# Patient Record
Sex: Female | Born: 1935 | Race: White | Hispanic: No | Smoking: Never smoker
Health system: Southern US, Community
[De-identification: ages and names within clinical notes are randomized; demographics above are authoritative.]

## PROBLEM LIST (undated history)

## (undated) DIAGNOSIS — F0390 Unspecified dementia without behavioral disturbance: Secondary | ICD-10-CM

## (undated) DIAGNOSIS — I972 Postmastectomy lymphedema syndrome: Secondary | ICD-10-CM

## (undated) DIAGNOSIS — M109 Gout, unspecified: Secondary | ICD-10-CM

## (undated) DIAGNOSIS — F419 Anxiety disorder, unspecified: Secondary | ICD-10-CM

## (undated) DIAGNOSIS — C801 Malignant (primary) neoplasm, unspecified: Secondary | ICD-10-CM

## (undated) DIAGNOSIS — E785 Hyperlipidemia, unspecified: Secondary | ICD-10-CM

## (undated) DIAGNOSIS — H409 Unspecified glaucoma: Secondary | ICD-10-CM

## (undated) DIAGNOSIS — F329 Major depressive disorder, single episode, unspecified: Secondary | ICD-10-CM

## (undated) DIAGNOSIS — N289 Disorder of kidney and ureter, unspecified: Secondary | ICD-10-CM

## (undated) DIAGNOSIS — F32A Depression, unspecified: Secondary | ICD-10-CM

## (undated) DIAGNOSIS — M810 Age-related osteoporosis without current pathological fracture: Secondary | ICD-10-CM

## (undated) DIAGNOSIS — H269 Unspecified cataract: Secondary | ICD-10-CM

## (undated) HISTORY — PX: EYE SURGERY: SHX253

## (undated) HISTORY — PX: ABDOMINAL HYSTERECTOMY: SHX81

## (undated) HISTORY — DX: Age-related osteoporosis without current pathological fracture: M81.0

## (undated) HISTORY — DX: Hyperlipidemia, unspecified: E78.5

## (undated) HISTORY — DX: Postmastectomy lymphedema syndrome: I97.2

## (undated) HISTORY — PX: BREAST SURGERY: SHX581

## (undated) HISTORY — DX: Malignant (primary) neoplasm, unspecified: C80.1

## (undated) HISTORY — DX: Unspecified cataract: H26.9

## (undated) HISTORY — DX: Unspecified glaucoma: H40.9

---

## 2004-12-05 ENCOUNTER — Other Ambulatory Visit: Payer: Self-pay

## 2004-12-05 ENCOUNTER — Emergency Department: Payer: Self-pay | Admitting: Emergency Medicine

## 2007-02-27 ENCOUNTER — Other Ambulatory Visit: Payer: Self-pay

## 2007-02-27 ENCOUNTER — Emergency Department: Payer: Self-pay | Admitting: Emergency Medicine

## 2007-03-01 ENCOUNTER — Encounter: Payer: Self-pay | Admitting: Family Medicine

## 2007-03-19 ENCOUNTER — Encounter: Payer: Self-pay | Admitting: Family Medicine

## 2007-04-18 ENCOUNTER — Encounter: Payer: Self-pay | Admitting: Family Medicine

## 2007-11-06 ENCOUNTER — Ambulatory Visit: Payer: Self-pay | Admitting: Gastroenterology

## 2008-04-24 IMAGING — US US EXTREM UP VENOUS*L*
1 series · 17 of 24 positions shown · non-contrast
Comparison: none

REASON FOR EXAM: left arm swelling, rm 18
COMMENTS:

[Series 1: us extrem up venous*left* · 17 of 31 slices shown]
[im 1/31]
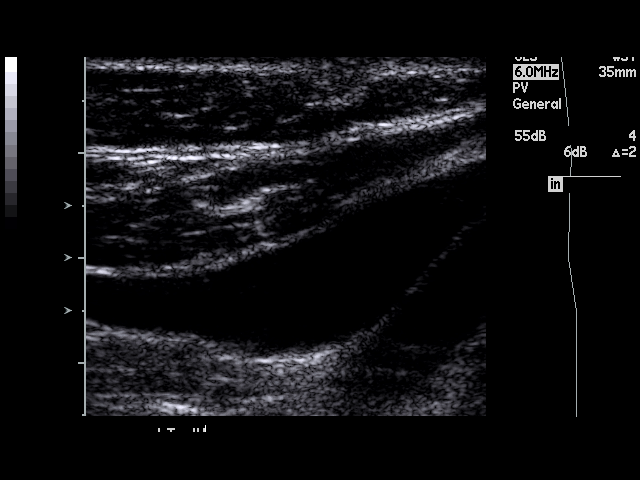
[im 3/31]
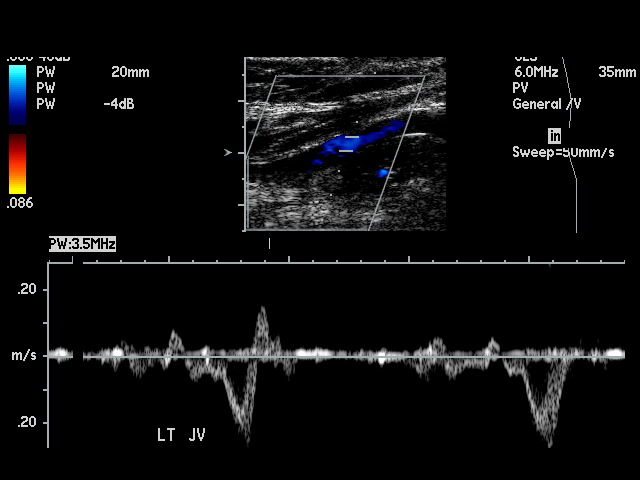
[im 4/31]
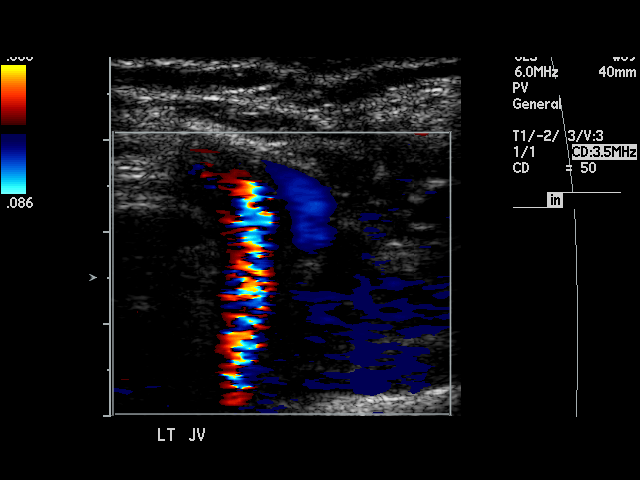
[im 6/31]
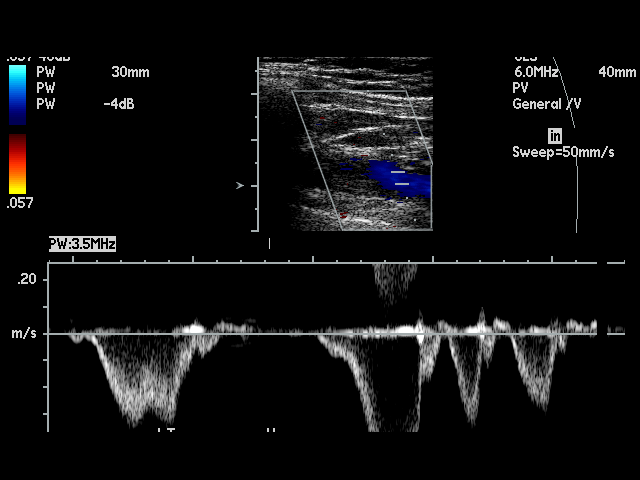
[im 8/31]
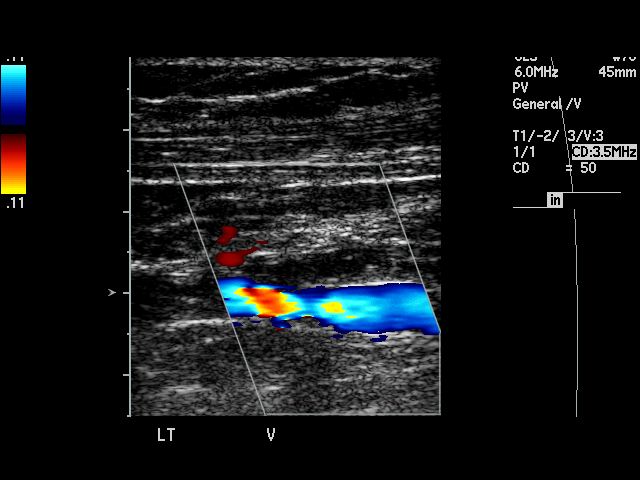
[im 10/31]
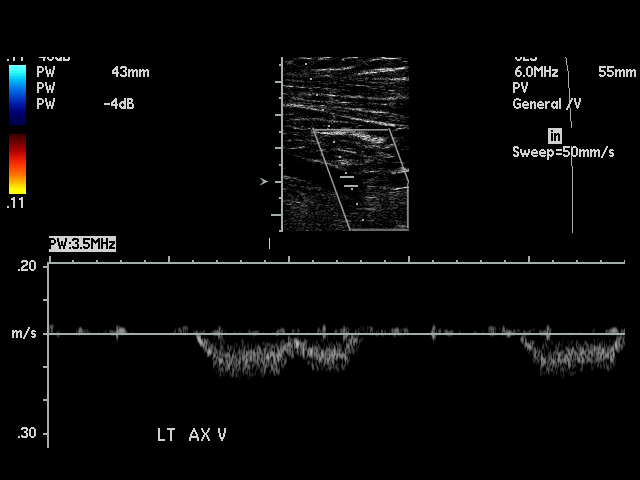
[im 12/31]
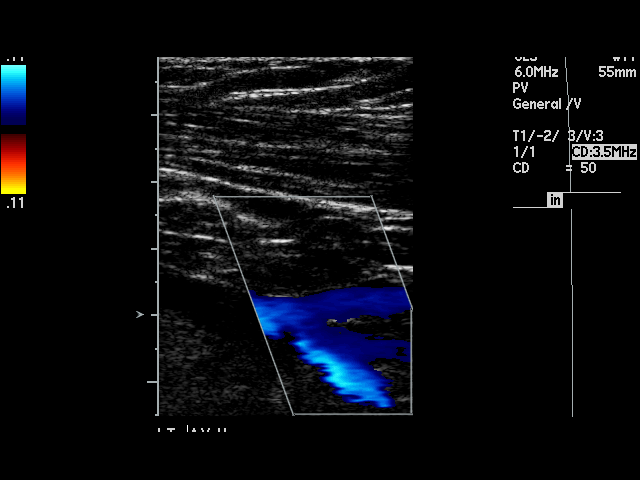
[im 14/31]
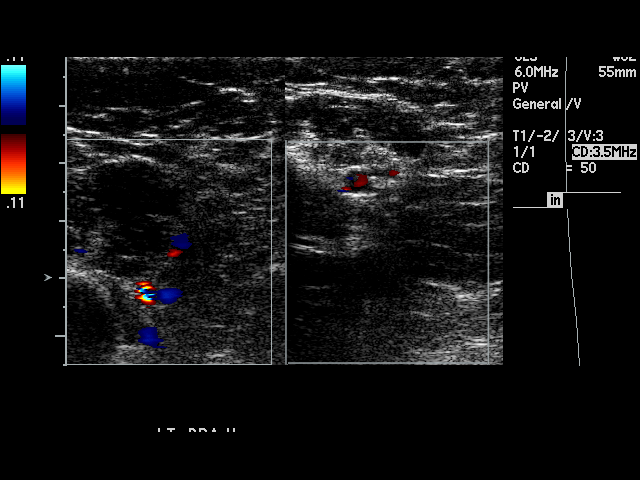
[im 16/31]
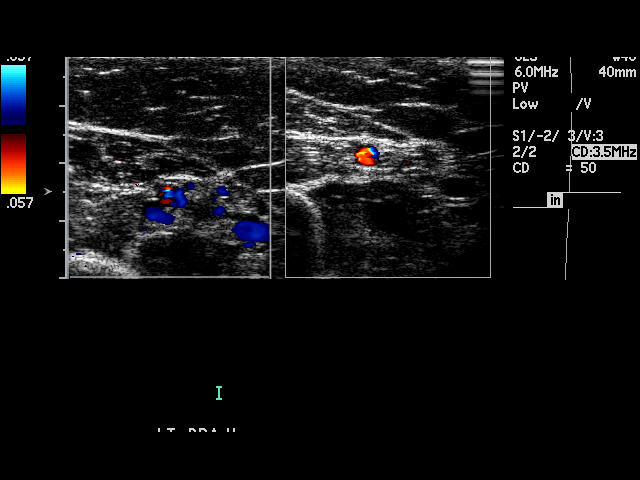
[im 17/31]
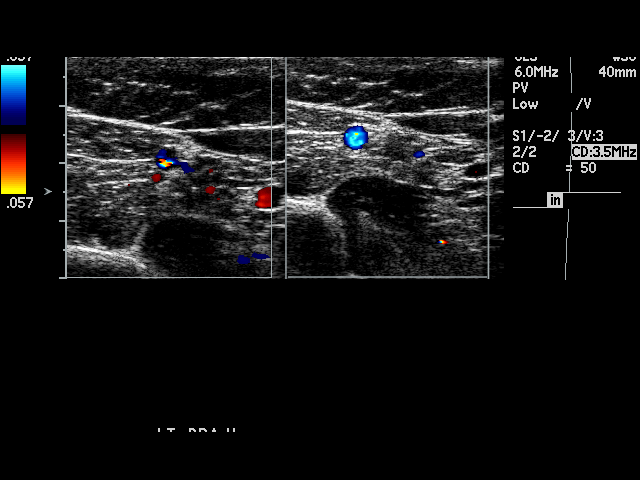
[im 19/31]
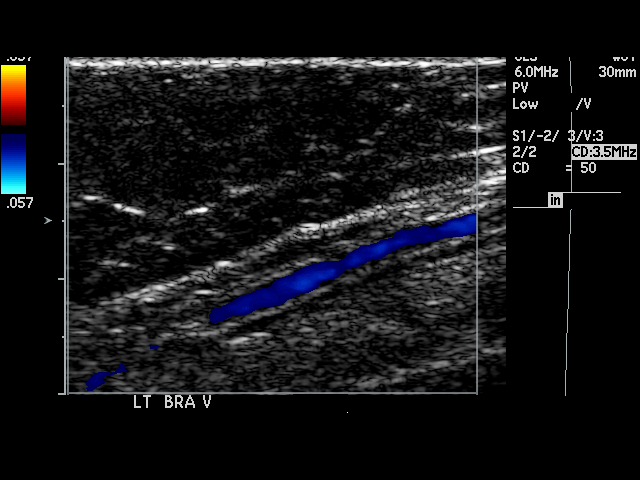
[im 21/31]
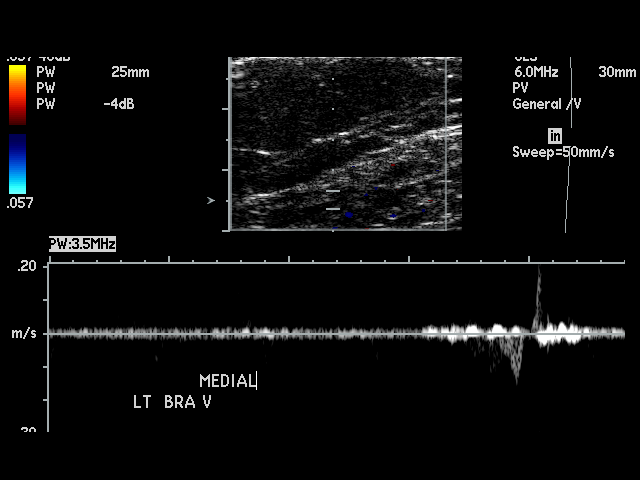
[im 23/31]
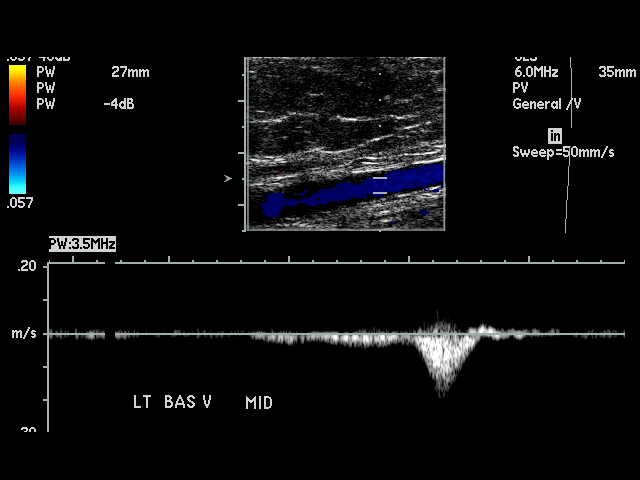
[im 25/31]
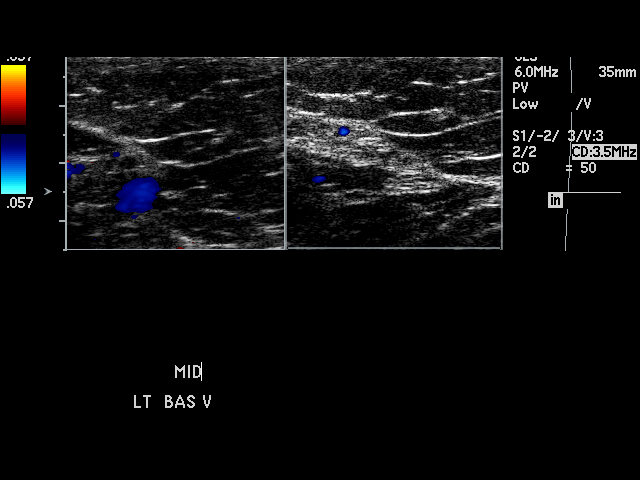
[im 27/31]
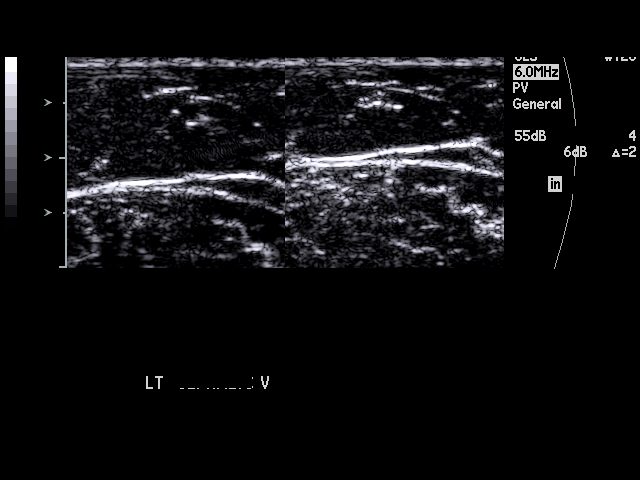
[im 28/31]
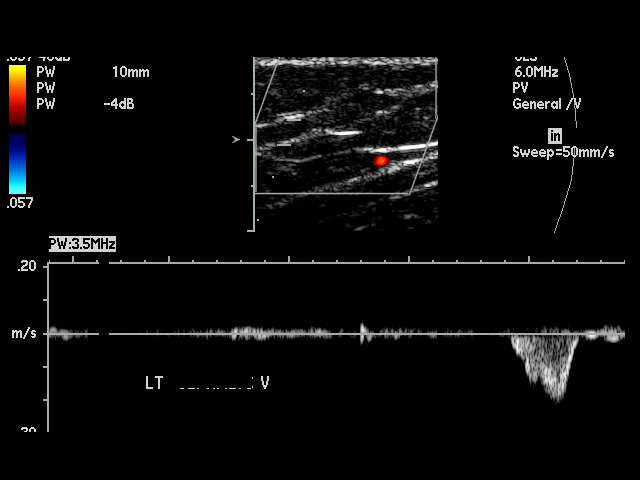
[im 31/31]
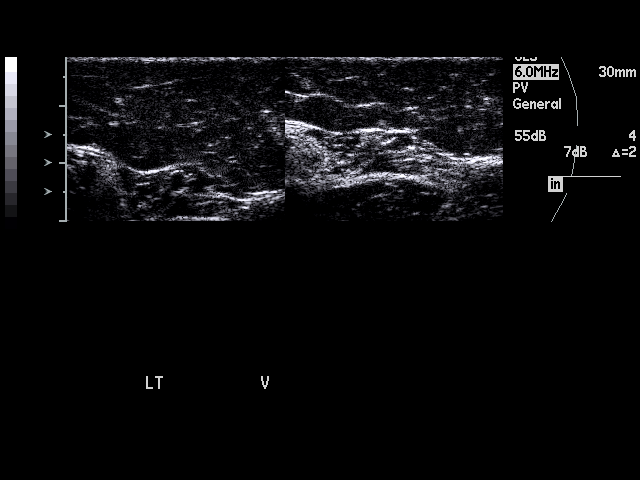

[17 of 24 positions shown; findings below may reference images not displayed]

PROCEDURE:     US  - US DOPPLER UP EXTR LEFT  - February 27, 2007  [DATE]

RESULT:     Duplex Doppler interrogation of the deep venous system of the
left upper extremity including portions of the internal jugular vein
demonstrate normal color and spectral Doppler appearance with normal
response to distal augmentation on the spectral Doppler images. There is
normal compressibility. There is no abnormal fluid collection.
IMPRESSION: 1. No evidence of left upper extremity DVT in the area included on the exam.

## 2008-08-16 ENCOUNTER — Encounter: Payer: Self-pay | Admitting: Family Medicine

## 2008-08-18 ENCOUNTER — Encounter: Payer: Self-pay | Admitting: Family Medicine

## 2009-07-01 ENCOUNTER — Ambulatory Visit: Payer: Self-pay | Admitting: Specialist

## 2009-09-08 ENCOUNTER — Ambulatory Visit: Payer: Self-pay | Admitting: Specialist

## 2009-09-19 ENCOUNTER — Ambulatory Visit: Payer: Self-pay | Admitting: Specialist

## 2009-09-19 HISTORY — PX: OTHER SURGICAL HISTORY: SHX169

## 2011-03-03 ENCOUNTER — Ambulatory Visit: Payer: Self-pay | Admitting: Ophthalmology

## 2011-03-16 ENCOUNTER — Ambulatory Visit: Payer: Self-pay | Admitting: Ophthalmology

## 2011-06-16 ENCOUNTER — Ambulatory Visit: Payer: Self-pay | Admitting: Ophthalmology

## 2011-06-30 ENCOUNTER — Ambulatory Visit: Payer: Self-pay | Admitting: Ophthalmology

## 2013-01-15 ENCOUNTER — Ambulatory Visit: Payer: Self-pay | Admitting: Gastroenterology

## 2013-01-16 ENCOUNTER — Ambulatory Visit: Payer: Self-pay | Admitting: Gastroenterology

## 2013-01-16 LAB — PATHOLOGY REPORT

## 2013-03-01 ENCOUNTER — Ambulatory Visit: Payer: Self-pay | Admitting: Gastroenterology

## 2015-05-06 ENCOUNTER — Other Ambulatory Visit: Payer: Self-pay | Admitting: Family Medicine

## 2015-05-15 DIAGNOSIS — I129 Hypertensive chronic kidney disease with stage 1 through stage 4 chronic kidney disease, or unspecified chronic kidney disease: Secondary | ICD-10-CM | POA: Insufficient documentation

## 2015-05-15 DIAGNOSIS — E785 Hyperlipidemia, unspecified: Secondary | ICD-10-CM | POA: Insufficient documentation

## 2015-05-15 DIAGNOSIS — N184 Chronic kidney disease, stage 4 (severe): Secondary | ICD-10-CM

## 2015-05-20 ENCOUNTER — Other Ambulatory Visit: Payer: Self-pay | Admitting: Family Medicine

## 2015-05-26 ENCOUNTER — Encounter: Payer: Self-pay | Admitting: Family Medicine

## 2015-05-26 ENCOUNTER — Ambulatory Visit (INDEPENDENT_AMBULATORY_CARE_PROVIDER_SITE_OTHER): Payer: Medicare Other | Admitting: Family Medicine

## 2015-05-26 VITALS — BP 157/73 | HR 75 | Temp 97.6°F | Ht 64.0 in | Wt 169.0 lb

## 2015-05-26 DIAGNOSIS — I10 Essential (primary) hypertension: Secondary | ICD-10-CM

## 2015-05-26 DIAGNOSIS — E785 Hyperlipidemia, unspecified: Secondary | ICD-10-CM | POA: Diagnosis not present

## 2015-05-26 DIAGNOSIS — H409 Unspecified glaucoma: Secondary | ICD-10-CM | POA: Insufficient documentation

## 2015-05-26 MED ORDER — LOSARTAN POTASSIUM 50 MG PO TABS: 50.0000 mg | ORAL_TABLET | Freq: Every day | ORAL | Status: DC

## 2015-05-26 MED ORDER — HYDROCHLOROTHIAZIDE 25 MG PO TABS: 25.0000 mg | ORAL_TABLET | Freq: Every day | ORAL | Status: DC

## 2015-05-26 MED ORDER — METOPROLOL TARTRATE 50 MG PO TABS: 50.0000 mg | ORAL_TABLET | Freq: Two times a day (BID) | ORAL | Status: DC

## 2015-05-26 NOTE — Assessment & Plan Note (Signed)
The current medical regimen is effective;  continue present plan and medications.  

## 2015-05-26 NOTE — Progress Notes (Signed)
   BP 157/73 mmHg  Pulse 75  Temp(Src) 97.6 F (36.4 C)  Ht 5\' 4"  (1.626 m)  Wt 169 lb (76.658 kg)  BMI 28.99 kg/m2  SpO2 98%   Subjective:    Patient ID: Katie Saunders, female    DOB: [birth year], 81 y.o.   MRN: [id number]  HPI: MARSHE Katie Saunders is a 81 y.o. female  Chief Complaint  Patient presents with  . Hypertension  . Hyperlipidemia   patient doing well blood pressure elevated here but otherwise blood pressures been doing well no complaints from medications Does not have lorazepam and not taking. Patient will double check at home Using eyedrops for glaucoma without problems Takes medications every day with no side effects.   Relevant past medical, surgical, family and social history reviewed and updated as indicated. Interim medical history since our last visit reviewed. Allergies and medications reviewed and updated.  Review of Systems  Constitutional: Negative.   Respiratory: Negative.   Cardiovascular: Negative.     Per HPI unless specifically indicated above     Objective:    BP 157/73 mmHg  Pulse 75  Temp(Src) 97.6 F (36.4 C)  Ht 5\' 4"  (1.626 m)  Wt 169 lb (76.658 kg)  BMI 28.99 kg/m2  SpO2 98%  Wt Readings from Last 3 Encounters:  05/26/15 169 lb (76.658 kg)  12/16/14 170 lb (77.111 kg)    Physical Exam  Constitutional: She appears well-developed and well-nourished.  Cardiovascular: Normal rate and regular rhythm.   Pulmonary/Chest: Effort normal and breath sounds normal.        Assessment & Plan:   Problem List Items Addressed This Visit      Cardiovascular and Mediastinum   Hypertension    BP controled with out of office checks      Relevant Medications   hydrochlorothiazide (HYDRODIURIL) 25 MG tablet   losartan (COZAAR) 50 MG tablet   metoprolol (LOPRESSOR) 50 MG tablet     Other   Hyperlipidemia    The current medical regimen is effective;  continue present plan and medications.       Relevant Medications   hydrochlorothiazide (HYDRODIURIL) 25 MG tablet   losartan (COZAAR) 50 MG tablet   metoprolol (LOPRESSOR) 50 MG tablet   Glaucoma   Relevant Medications   LUMIGAN 0.01 % SOLN   COMBIGAN 0.2-0.5 % ophthalmic solution   dorzolamide (TRUSOPT) 2 % ophthalmic solution    Other Visit Diagnoses    Essential hypertension, benign    -  Primary    Relevant Medications    hydrochlorothiazide (HYDRODIURIL) 25 MG tablet    losartan (COZAAR) 50 MG tablet    metoprolol (LOPRESSOR) 50 MG tablet    Other Relevant Orders    Basic metabolic panel        Follow up plan: Return in about 6 months (around 11/26/2015), or if symptoms worsen or fail to improve, for Physical Exam.

## 2015-05-26 NOTE — Assessment & Plan Note (Signed)
BP controled with out of office checks

## 2015-05-27 LAB — BASIC METABOLIC PANEL WITH GFR
BUN/Creatinine Ratio: 13 (ref 11–26)
BUN: 22 mg/dL (ref 8–27)
CO2: 23 mmol/L (ref 18–29)
Calcium: 9.5 mg/dL (ref 8.7–10.3)
Chloride: 103 mmol/L (ref 97–108)
Creatinine, Ser: 1.65 mg/dL — ABNORMAL HIGH (ref 0.57–1.00)
GFR calc Af Amer: 33 mL/min/1.73 — ABNORMAL LOW
GFR calc non Af Amer: 29 mL/min/1.73 — ABNORMAL LOW
Glucose: 105 mg/dL — ABNORMAL HIGH (ref 65–99)
Potassium: 4 mmol/L (ref 3.5–5.2)
Sodium: 141 mmol/L (ref 134–144)

## 2015-07-19 ENCOUNTER — Other Ambulatory Visit: Payer: Self-pay | Admitting: Family Medicine

## 2015-09-22 ENCOUNTER — Encounter: Payer: Self-pay | Admitting: Family Medicine

## 2015-09-22 ENCOUNTER — Ambulatory Visit (INDEPENDENT_AMBULATORY_CARE_PROVIDER_SITE_OTHER): Payer: Medicare Other | Admitting: Family Medicine

## 2015-09-22 VITALS — BP 122/73 | HR 79 | Temp 98.2°F | Ht 65.0 in | Wt 169.0 lb

## 2015-09-22 DIAGNOSIS — F0390 Unspecified dementia without behavioral disturbance: Secondary | ICD-10-CM | POA: Diagnosis not present

## 2015-09-22 DIAGNOSIS — I10 Essential (primary) hypertension: Secondary | ICD-10-CM

## 2015-09-22 DIAGNOSIS — M10372 Gout due to renal impairment, left ankle and foot: Secondary | ICD-10-CM

## 2015-09-22 DIAGNOSIS — M109 Gout, unspecified: Secondary | ICD-10-CM | POA: Insufficient documentation

## 2015-09-22 MED ORDER — FEBUXOSTAT 40 MG PO TABS: 40.0000 mg | ORAL_TABLET | Freq: Every day | ORAL | Status: DC

## 2015-09-22 MED ORDER — COLCHICINE 0.6 MG PO TABS: 0.6000 mg | ORAL_TABLET | Freq: Every day | ORAL | Status: DC

## 2015-09-22 MED ORDER — DONEPEZIL HCL 5 MG PO TABS: 5.0000 mg | ORAL_TABLET | Freq: Every day | ORAL | Status: DC

## 2015-09-22 NOTE — Assessment & Plan Note (Signed)
Discuss Treatment primarily with daughter Burnis Medin use colchicine 1 a day for aproximally 2 months Start Uloric 40 mg use because of chronic kidney disease

## 2015-09-22 NOTE — Assessment & Plan Note (Signed)
For memory will check labs and has apt in 2 weeks

## 2015-09-22 NOTE — Progress Notes (Signed)
BP 122/73 mmHg  Pulse 79  Temp(Src) 98.2 F (36.8 C)  Ht 5\' 5"  (1.651 m)  Wt 169 lb (76.658 kg)  BMI 28.12 kg/m2  SpO2 99%   Subjective:    Patient ID: Katie Saunders, female    DOB: Feb 11, 1931, 81 y.o.   MRN: [id number]  HPI: Katie Saunders is a 81 y.o. female  Chief Complaint  Patient presents with  . Foot Pain    left   patient accompanied by her daughter who provide significant history as the patient has very poor memory. Development of dementia is one of the background issues. Patient's left foot pain developed with marked redness swelling patient's daughter shows me a picture of her foot markedly swollen which is gotten significantly better without pain after he got a steroid shot last week in her left knee. Patient's had elevated uric acid levels in the past and has decided not to take medication for gout. She has had this problem with gout previously.  Patient's memory has gotten significantly worse to the point where daughter is concerned about safety issues. Patient had multiple gaps and history that is noted above.  Patient was giving meloxicam for her knee which is helped. But I'm very concerned and told the patient not to take due to chronic kidney disease  Relevant past medical, surgical, family and social history reviewed and updated as indicated. Interim medical history since our last visit reviewed. Allergies and medications reviewed and updated.  Review of Systems  Constitutional: Negative.   Respiratory: Negative.   Cardiovascular: Negative.     Per HPI unless specifically indicated above     Objective:    BP 122/73 mmHg  Pulse 79  Temp(Src) 98.2 F (36.8 C)  Ht 5\' 5"  (1.651 m)  Wt 169 lb (76.658 kg)  BMI 28.12 kg/m2  SpO2 99%  Wt Readings from Last 3 Encounters:  09/22/15 169 lb (76.658 kg)  05/26/15 169 lb (76.658 kg)  12/16/14 170 lb (77.111 kg)    Physical Exam  Constitutional: She is oriented to person, place, and time. She appears  well-developed and well-nourished. No distress.  HENT:  Head: Normocephalic and atraumatic.  Right Ear: Hearing normal.  Left Ear: Hearing normal.  Nose: Nose normal.  Eyes: Conjunctivae and lids are normal. Right eye exhibits no discharge. Left eye exhibits no discharge. No scleral icterus.  Cardiovascular: Normal rate, regular rhythm and normal heart sounds.   Pulmonary/Chest: Effort normal. No respiratory distress.  Musculoskeletal: Normal range of motion.  Neurological: She is alert and oriented to person, place, and time.  Skin: Skin is intact. No rash noted.  Left bunion area markedly inflamed evidence of previous swelling  Psychiatric: She has a normal mood and affect. Her speech is normal and behavior is normal. Cognition and memory are normal.    Results for orders placed or performed in visit on 0000000  Basic metabolic panel  Result Value Ref Range   Glucose 105 (H) 65 - 99 mg/dL   BUN 22 8 - 27 mg/dL   Creatinine, Ser 1.65 (H) 0.57 - 1.00 mg/dL   GFR calc non Af Amer 29 (L) >59 mL/min/1.73   GFR calc Af Amer 33 (L) >59 mL/min/1.73   BUN/Creatinine Ratio 13 11 - 26   Sodium 141 134 - 144 mmol/L   Potassium 4.0 3.5 - 5.2 mmol/L   Chloride 103 97 - 108 mmol/L   CO2 23 18 - 29 mmol/L   Calcium 9.5 8.7 - 10.3  mg/dL      Assessment & Plan:   Problem List Items Addressed This Visit      Cardiovascular and Mediastinum   Hypertension     Nervous and Auditory   Dementia    For memory will check labs and has apt in 2 weeks      Relevant Medications   donepezil (ARICEPT) 5 MG tablet   Other Relevant Orders   Comprehensive metabolic panel   TSH   Vitamin B12     Other   Gout - Primary    Discuss Treatment primarily with daughter We'll use colchicine 1 a day for aproximally 2 months Start Uloric 40 mg use because of chronic kidney disease      Relevant Medications   colchicine 0.6 MG tablet   febuxostat (ULORIC) 40 MG tablet   Other Relevant Orders   Basic  metabolic panel   Uric acid       Follow up plan: Return for As scheduled.

## 2015-09-23 ENCOUNTER — Ambulatory Visit: Payer: Medicare Other | Admitting: Family Medicine

## 2015-09-23 LAB — COMPREHENSIVE METABOLIC PANEL
A/G RATIO: 1.6 (ref 1.1–2.5)
ALBUMIN: 3.7 g/dL (ref 3.5–4.7)
ALT: 9 IU/L (ref 0–32)
AST: 13 IU/L (ref 0–40)
Alkaline Phosphatase: 55 IU/L (ref 39–117)
BUN / CREAT RATIO: 24 (ref 11–26)
BUN: 37 mg/dL — ABNORMAL HIGH (ref 8–27)
Bilirubin Total: 0.2 mg/dL (ref 0.0–1.2)
CALCIUM: 9.3 mg/dL (ref 8.7–10.3)
CO2: 26 mmol/L (ref 18–29)
Chloride: 102 mmol/L (ref 97–106)
Creatinine, Ser: 1.57 mg/dL — ABNORMAL HIGH (ref 0.57–1.00)
GFR, EST AFRICAN AMERICAN: 35 mL/min/{1.73_m2} — AB (ref >59)
GFR, EST NON AFRICAN AMERICAN: 30 mL/min/{1.73_m2} — AB (ref >59)
GLOBULIN, TOTAL: 2.3 g/dL (ref 1.5–4.5)
Glucose: 108 mg/dL — ABNORMAL HIGH (ref 65–99)
POTASSIUM: 4.1 mmol/L (ref 3.5–5.2)
SODIUM: 143 mmol/L (ref 136–144)
TOTAL PROTEIN: 6 g/dL (ref 6.0–8.5)

## 2015-09-23 LAB — VITAMIN B12: Vitamin B-12: 320 pg/mL (ref 211–946)

## 2015-09-23 LAB — TSH: TSH: 2.93 u[IU]/mL (ref 0.450–4.500)

## 2015-09-23 LAB — URIC ACID: Uric Acid: 8.9 mg/dL — ABNORMAL HIGH (ref 2.5–7.1)

## 2015-10-01 ENCOUNTER — Encounter: Payer: Self-pay | Admitting: Family Medicine

## 2015-10-01 ENCOUNTER — Ambulatory Visit (INDEPENDENT_AMBULATORY_CARE_PROVIDER_SITE_OTHER): Payer: Medicare Other | Admitting: Family Medicine

## 2015-10-01 VITALS — BP 158/68 | HR 66 | Temp 97.8°F | Ht 65.0 in | Wt 164.0 lb

## 2015-10-01 DIAGNOSIS — N184 Chronic kidney disease, stage 4 (severe): Secondary | ICD-10-CM | POA: Diagnosis not present

## 2015-10-01 DIAGNOSIS — I129 Hypertensive chronic kidney disease with stage 1 through stage 4 chronic kidney disease, or unspecified chronic kidney disease: Secondary | ICD-10-CM | POA: Diagnosis not present

## 2015-10-01 DIAGNOSIS — F0390 Unspecified dementia without behavioral disturbance: Secondary | ICD-10-CM

## 2015-10-01 DIAGNOSIS — M10372 Gout due to renal impairment, left ankle and foot: Secondary | ICD-10-CM

## 2015-10-01 NOTE — Assessment & Plan Note (Signed)
Stable medicine with no side effects

## 2015-10-01 NOTE — Assessment & Plan Note (Signed)
Blood pressure stable CK D for years is been stable  Discussed again which medications to avoid such as nonsteroidal anti-inflammatory agents

## 2015-10-01 NOTE — Progress Notes (Addendum)
BP 158/68 mmHg  Pulse 66  Temp(Src) 97.8 F (36.6 C)  Ht 5\' 5"  (1.651 m)  Wt 164 lb (74.39 kg)  BMI 27.29 kg/m2  SpO2 98%   Subjective:    Patient ID: Katie Saunders, female    DOB: 1931/02/22, 81 y.o.   MRN: [id number]  HPI: Katie Saunders is a 81 y.o. female  Chief Complaint  Patient presents with  . memory issues   Patient accompanied by her daughter who assists with history patient with no side effects to medications. Taking medications faithfully with no nausea Taking colchicine and Uloric no further gout issues Knee bothers her some and hobbles all she walks has seen orthopedics as noted in previous note.  Relevant past medical, surgical, family and social history reviewed and updated as indicated. Interim medical history since our last visit reviewed. Allergies and medications reviewed and updated.  Review of Systems  Constitutional: Negative.   Respiratory: Negative.   Cardiovascular: Negative.     Per HPI unless specifically indicated above     Objective:    BP 158/68 mmHg  Pulse 66  Temp(Src) 97.8 F (36.6 C)  Ht 5\' 5"  (1.651 m)  Wt 164 lb (74.39 kg)  BMI 27.29 kg/m2  SpO2 98%  Wt Readings from Last 3 Encounters:  10/01/15 164 lb (74.39 kg)  09/22/15 169 lb (76.658 kg)  05/26/15 169 lb (76.658 kg)    Physical Exam  Constitutional: She is oriented to person, place, and time. She appears well-developed and well-nourished. No distress.  HENT:  Head: Normocephalic and atraumatic.  Right Ear: Hearing normal.  Left Ear: Hearing normal.  Nose: Nose normal.  Eyes: Conjunctivae and lids are normal. Right eye exhibits no discharge. Left eye exhibits no discharge. No scleral icterus.  Cardiovascular: Normal rate, regular rhythm and normal heart sounds.   Pulmonary/Chest: Effort normal and breath sounds normal. No respiratory distress.  Musculoskeletal: Normal range of motion.  Foot with no redness at all no swelling  Neurological: She is alert and  oriented to person, place, and time.  Skin: Skin is intact. No rash noted.  Psychiatric: She has a normal mood and affect. Her speech is normal and behavior is normal. Cognition and memory are normal.    Results for orders placed or performed in visit on 09/22/15  Uric acid  Result Value Ref Range   Uric Acid 8.9 (H) 2.5 - 7.1 mg/dL  Comprehensive metabolic panel  Result Value Ref Range   Glucose 108 (H) 65 - 99 mg/dL   BUN 37 (H) 8 - 27 mg/dL   Creatinine, Ser 1.57 (H) 0.57 - 1.00 mg/dL   GFR calc non Af Amer 30 (L) >59 mL/min/1.73   GFR calc Af Amer 35 (L) >59 mL/min/1.73   BUN/Creatinine Ratio 24 11 - 26   Sodium 143 136 - 144 mmol/L   Potassium 4.1 3.5 - 5.2 mmol/L   Chloride 102 97 - 106 mmol/L   CO2 26 18 - 29 mmol/L   Calcium 9.3 8.7 - 10.3 mg/dL   Total Protein 6.0 6.0 - 8.5 g/dL   Albumin 3.7 3.5 - 4.7 g/dL   Globulin, Total 2.3 1.5 - 4.5 g/dL   Albumin/Globulin Ratio 1.6 1.1 - 2.5   Bilirubin Total 0.2 0.0 - 1.2 mg/dL   Alkaline Phosphatase 55 39 - 117 IU/L   AST 13 0 - 40 IU/L   ALT 9 0 - 32 IU/L  TSH  Result Value Ref Range   TSH  2.930 0.450 - 4.500 uIU/mL  Vitamin B12  Result Value Ref Range   Vitamin B-12 320 211 - 946 pg/mL      Assessment & Plan:   Problem List Items Addressed This Visit      Nervous and Auditory   Dementia    Stable medicine with no side effects        Genitourinary   CKD stage 4 secondary to hypertension ([place])    Blood pressure stable CK D for years is been stable  Discussed again which medications to avoid such as nonsteroidal anti-inflammatory agents        Other   Gout - Primary    Taking colchicine and uloric no further gout flairs          Follow up plan: Return in about 3 months (around 12/30/2015) for Physical Exam.

## 2015-10-01 NOTE — Assessment & Plan Note (Signed)
Taking colchicine and uloric no further gout flairs

## 2015-12-01 ENCOUNTER — Encounter: Payer: Self-pay | Admitting: Family Medicine

## 2015-12-01 ENCOUNTER — Ambulatory Visit (INDEPENDENT_AMBULATORY_CARE_PROVIDER_SITE_OTHER): Payer: Medicare Other | Admitting: Family Medicine

## 2015-12-01 VITALS — BP 111/68 | HR 69 | Temp 97.6°F | Ht 63.3 in | Wt 162.0 lb

## 2015-12-01 DIAGNOSIS — I129 Hypertensive chronic kidney disease with stage 1 through stage 4 chronic kidney disease, or unspecified chronic kidney disease: Secondary | ICD-10-CM

## 2015-12-01 DIAGNOSIS — N184 Chronic kidney disease, stage 4 (severe): Secondary | ICD-10-CM | POA: Diagnosis not present

## 2015-12-01 DIAGNOSIS — F0390 Unspecified dementia without behavioral disturbance: Secondary | ICD-10-CM

## 2015-12-01 DIAGNOSIS — R300 Dysuria: Secondary | ICD-10-CM | POA: Diagnosis not present

## 2015-12-01 DIAGNOSIS — Z0000 Encounter for general adult medical examination without abnormal findings: Secondary | ICD-10-CM

## 2015-12-01 DIAGNOSIS — I10 Essential (primary) hypertension: Secondary | ICD-10-CM

## 2015-12-01 DIAGNOSIS — M10372 Gout due to renal impairment, left ankle and foot: Secondary | ICD-10-CM | POA: Diagnosis not present

## 2015-12-01 LAB — URINALYSIS, ROUTINE W REFLEX MICROSCOPIC
Bilirubin, UA: NEGATIVE
Glucose, UA: NEGATIVE
Ketones, UA: NEGATIVE
Nitrite, UA: NEGATIVE
PH UA: 5 (ref 5.0–7.5)
PROTEIN UA: NEGATIVE
RBC, UA: NEGATIVE
Specific Gravity, UA: 1.025 (ref 1.005–1.030)
Urobilinogen, Ur: 0.2 mg/dL (ref 0.2–1.0)

## 2015-12-01 LAB — MICROSCOPIC EXAMINATION: Epithelial Cells (non renal): >10 /hpf — AB (ref 0–10)

## 2015-12-01 MED ORDER — METOPROLOL TARTRATE 50 MG PO TABS: 50.0000 mg | ORAL_TABLET | Freq: Two times a day (BID) | ORAL | Status: DC

## 2015-12-01 MED ORDER — LOSARTAN POTASSIUM 50 MG PO TABS: 50.0000 mg | ORAL_TABLET | Freq: Every day | ORAL | Status: DC

## 2015-12-01 MED ORDER — HYDROCHLOROTHIAZIDE 25 MG PO TABS: 25.0000 mg | ORAL_TABLET | Freq: Every day | ORAL | Status: DC

## 2015-12-01 MED ORDER — DONEPEZIL HCL 5 MG PO TABS: 5.0000 mg | ORAL_TABLET | Freq: Every day | ORAL | Status: DC

## 2015-12-01 MED ORDER — FEBUXOSTAT 40 MG PO TABS: 40.0000 mg | ORAL_TABLET | Freq: Every day | ORAL | Status: DC

## 2015-12-01 NOTE — Assessment & Plan Note (Signed)
The current medical regimen is effective;  continue present plan and medications.  

## 2015-12-01 NOTE — Assessment & Plan Note (Signed)
The current medical regimen is effective;  continue present plan and medications. Stable on meds

## 2015-12-01 NOTE — Assessment & Plan Note (Addendum)
The current medical regimen is effective;  continue present plan and medications.  

## 2015-12-01 NOTE — Progress Notes (Signed)
BP 111/68 mmHg  Pulse 69  Temp(Src) 97.6 F (36.4 C)  Ht 5' 3.3" (1.608 m)  Wt 162 lb (73.483 kg)  BMI 28.42 kg/m2  SpO2 97%   Subjective:    Patient ID: Katie Saunders, female    DOB: 12/11/1930, 82 y.o.   MRN: [id number]  HPI: Katie Saunders is a 82 y.o. female  Chief Complaint  Patient presents with  . Annual Exam    follow-up doing very well taking medications without concerns. Like to stop Aricept reviewed reasons for taking primarily to prevent memory loss and revealed a maintain independent living. Will continue this medication as it's not expensive and there are no side effects. Other medications blood pressure and gout doing fine   Relevant past medical, surgical, family and social history reviewed and updated as indicated. Interim medical history since our last visit reviewed. Allergies and medications reviewed and updated.  Review of Systems  Constitutional: Negative.   HENT: Negative.   Eyes: Negative.   Respiratory: Negative.   Cardiovascular: Negative.   Gastrointestinal: Negative.   Endocrine: Negative.   Genitourinary: Negative.   Musculoskeletal: Negative.   Skin: Negative.   Allergic/Immunologic: Negative.   Neurological: Negative.   Hematological: Negative.   Psychiatric/Behavioral: Negative.     Per HPI unless specifically indicated above     Objective:    BP 111/68 mmHg  Pulse 69  Temp(Src) 97.6 F (36.4 C)  Ht 5' 3.3" (1.608 m)  Wt 162 lb (73.483 kg)  BMI 28.42 kg/m2  SpO2 97%  Wt Readings from Last 3 Encounters:  12/01/15 162 lb (73.483 kg)  10/01/15 164 lb (74.39 kg)  09/22/15 169 lb (76.658 kg)    Physical Exam  Constitutional: She is oriented to person, place, and time. She appears well-developed and well-nourished. No distress.  HENT:  Head: Normocephalic and atraumatic.  Right Ear: Hearing and external ear normal.  Left Ear: Hearing and external ear normal.  Nose: Nose normal.  Mouth/Throat: Oropharynx is clear and  moist.  Eyes: Conjunctivae, EOM and lids are normal. Pupils are equal, round, and reactive to light. Right eye exhibits no discharge. Left eye exhibits no discharge. No scleral icterus.  Neck: Normal range of motion. Neck supple. Carotid bruit is not present.  Cardiovascular: Normal rate, regular rhythm and normal heart sounds.   No murmur heard. Pulmonary/Chest: Effort normal and breath sounds normal. No respiratory distress. She exhibits no mass. Right breast exhibits no mass, no skin change and no tenderness. Left breast exhibits no mass, no skin change and no tenderness. Breasts are symmetrical.  Abdominal: Soft. Bowel sounds are normal. There is no hepatosplenomegaly.  Musculoskeletal: Normal range of motion.  Neurological: She is alert and oriented to person, place, and time.  Skin: Skin is intact. No rash noted.  Psychiatric: She has a normal mood and affect. Her speech is normal and behavior is normal. Judgment and thought content normal. Cognition and memory are normal.    Results for orders placed or performed in visit on 09/22/15  Uric acid  Result Value Ref Range   Uric Acid 8.9 (H) 2.5 - 7.1 mg/dL  Comprehensive metabolic panel  Result Value Ref Range   Glucose 108 (H) 65 - 99 mg/dL   BUN 37 (H) 8 - 27 mg/dL   Creatinine, Ser 1.57 (H) 0.57 - 1.00 mg/dL   GFR calc non Af Amer 30 (L) >59 mL/min/1.73   GFR calc Af Amer 35 (L) >59 mL/min/1.73   BUN/Creatinine Ratio  24 11 - 26   Sodium 143 136 - 144 mmol/L   Potassium 4.1 3.5 - 5.2 mmol/L   Chloride 102 97 - 106 mmol/L   CO2 26 18 - 29 mmol/L   Calcium 9.3 8.7 - 10.3 mg/dL   Total Protein 6.0 6.0 - 8.5 g/dL   Albumin 3.7 3.5 - 4.7 g/dL   Globulin, Total 2.3 1.5 - 4.5 g/dL   Albumin/Globulin Ratio 1.6 1.1 - 2.5   Bilirubin Total 0.2 0.0 - 1.2 mg/dL   Alkaline Phosphatase 55 39 - 117 IU/L   AST 13 0 - 40 IU/L   ALT 9 0 - 32 IU/L  TSH  Result Value Ref Range   TSH 2.930 0.450 - 4.500 uIU/mL  Vitamin B12  Result Value  Ref Range   Vitamin B-12 320 211 - 946 pg/mL      Assessment & Plan:   Problem List Items Addressed This Visit      Cardiovascular and Mediastinum   Essential hypertension - Primary    The current medical regimen is effective;  continue present plan and medications.       Relevant Medications   metoprolol (LOPRESSOR) 50 MG tablet   losartan (COZAAR) 50 MG tablet   hydrochlorothiazide (HYDRODIURIL) 25 MG tablet   Other Relevant Orders   Comprehensive metabolic panel   Lipid panel   CBC with Differential/Platelet   TSH   Urinalysis, Routine w reflex microscopic (not at [facility])   Uric acid     Nervous and Auditory   Dementia    The current medical regimen is effective;  continue present plan and medications. Stable on meds      Relevant Medications   donepezil (ARICEPT) 5 MG tablet   Other Relevant Orders   Comprehensive metabolic panel   Lipid panel   CBC with Differential/Platelet   TSH   Urinalysis, Routine w reflex microscopic (not at [facility])   Uric acid     Genitourinary   CKD stage 4 secondary to hypertension ([place])    The current medical regimen is effective;  continue present plan and medications.       Relevant Orders   Comprehensive metabolic panel   Lipid panel   CBC with Differential/Platelet   TSH   Urinalysis, Routine w reflex microscopic (not at [facility])   Uric acid     Other   Gout    The current medical regimen is effective;  continue present plan and medications.       Relevant Medications   febuxostat (ULORIC) 40 MG tablet   Other Relevant Orders   Comprehensive metabolic panel   Lipid panel   CBC with Differential/Platelet   TSH   Urinalysis, Routine w reflex microscopic (not at [facility])   Uric acid    Other Visit Diagnoses    PE (physical exam), annual        Relevant Orders    Comprehensive metabolic panel    Lipid panel    CBC with Differential/Platelet    TSH    Urinalysis, Routine w reflex microscopic (not at [facility])    Uric  acid    Essential hypertension, benign        Relevant Medications    metoprolol (LOPRESSOR) 50 MG tablet    losartan (COZAAR) 50 MG tablet    hydrochlorothiazide (HYDRODIURIL) 25 MG tablet    Other Relevant Orders    Comprehensive metabolic panel    Lipid panel    CBC with Differential/Platelet  TSH    Urinalysis, Routine w reflex microscopic (not at [facility])    Uric acid        Follow up plan: Return in about 6 months (around 05/30/2016) for med check this summer, BMP, uric acid.

## 2015-12-01 NOTE — Addendum Note (Signed)
Addended byGolden Pop on: 12/01/2015 04:09 PM   Modules accepted: Orders, SmartSet

## 2015-12-02 LAB — CBC WITH DIFFERENTIAL/PLATELET
BASOS ABS: 0 10*3/uL (ref 0.0–0.2)
Basos: 1 %
EOS (ABSOLUTE): 0.1 10*3/uL (ref 0.0–0.4)
EOS: 2 %
HEMATOCRIT: 34.9 % (ref 34.0–46.6)
HEMOGLOBIN: 11.3 g/dL (ref 11.1–15.9)
IMMATURE GRANS (ABS): 0 10*3/uL (ref 0.0–0.1)
IMMATURE GRANULOCYTES: 0 %
LYMPHS: 32 %
Lymphocytes Absolute: 1.8 10*3/uL (ref 0.7–3.1)
MCH: 29.4 pg (ref 26.6–33.0)
MCHC: 32.4 g/dL (ref 31.5–35.7)
MCV: 91 fL (ref 79–97)
MONOCYTES: 7 %
Monocytes Absolute: 0.4 10*3/uL (ref 0.1–0.9)
NEUTROS PCT: 58 %
Neutrophils Absolute: 3.4 10*3/uL (ref 1.4–7.0)
PLATELETS: 229 10*3/uL (ref 150–379)
RBC: 3.85 x10E6/uL (ref 3.77–5.28)
RDW: 14.7 % (ref 12.3–15.4)
WBC: 5.8 10*3/uL (ref 3.4–10.8)

## 2015-12-02 LAB — COMPREHENSIVE METABOLIC PANEL
A/G RATIO: 1.7 (ref 1.1–2.5)
ALT: 8 IU/L (ref 0–32)
AST: 11 IU/L (ref 0–40)
Albumin: 4 g/dL (ref 3.5–4.7)
Alkaline Phosphatase: 58 IU/L (ref 39–117)
BILIRUBIN TOTAL: 0.5 mg/dL (ref 0.0–1.2)
BUN/Creatinine Ratio: 16 (ref 11–26)
BUN: 25 mg/dL (ref 8–27)
CALCIUM: 9.2 mg/dL (ref 8.7–10.3)
CHLORIDE: 105 mmol/L (ref 96–106)
CO2: 25 mmol/L (ref 18–29)
Creatinine, Ser: 1.53 mg/dL — ABNORMAL HIGH (ref 0.57–1.00)
GFR, EST AFRICAN AMERICAN: 36 mL/min/{1.73_m2} — AB (ref >59)
GFR, EST NON AFRICAN AMERICAN: 31 mL/min/{1.73_m2} — AB (ref >59)
GLOBULIN, TOTAL: 2.3 g/dL (ref 1.5–4.5)
Glucose: 98 mg/dL (ref 65–99)
POTASSIUM: 4.4 mmol/L (ref 3.5–5.2)
SODIUM: 146 mmol/L — AB (ref 134–144)
TOTAL PROTEIN: 6.3 g/dL (ref 6.0–8.5)

## 2015-12-02 LAB — TSH: TSH: 2.51 u[IU]/mL (ref 0.450–4.500)

## 2015-12-02 LAB — LIPID PANEL
CHOL/HDL RATIO: 4.3 ratio (ref 0.0–4.4)
Cholesterol, Total: 230 mg/dL — ABNORMAL HIGH (ref 100–199)
HDL: 54 mg/dL (ref >39)
LDL Calculated: 154 mg/dL — ABNORMAL HIGH (ref 0–99)
Triglycerides: 112 mg/dL (ref 0–149)
VLDL Cholesterol Cal: 22 mg/dL (ref 5–40)

## 2015-12-02 LAB — URIC ACID: URIC ACID: 8.5 mg/dL — AB (ref 2.5–7.1)

## 2015-12-03 LAB — URINE CULTURE

## 2016-03-24 ENCOUNTER — Encounter
Admission: RE | Admit: 2016-03-24 | Discharge: 2016-03-24 | Disposition: A | Discharge status: Home / Self Care | Payer: Medicare Other | Source: Ambulatory Visit | Attending: Orthopedic Surgery | Admitting: Orthopedic Surgery

## 2016-03-24 DIAGNOSIS — I10 Essential (primary) hypertension: Secondary | ICD-10-CM | POA: Diagnosis not present

## 2016-03-24 DIAGNOSIS — Z01812 Encounter for preprocedural laboratory examination: Secondary | ICD-10-CM | POA: Insufficient documentation

## 2016-03-24 DIAGNOSIS — Z01810 Encounter for preprocedural cardiovascular examination: Secondary | ICD-10-CM | POA: Diagnosis not present

## 2016-03-24 HISTORY — DX: Disorder of kidney and ureter, unspecified: N28.9

## 2016-03-24 HISTORY — DX: Major depressive disorder, single episode, unspecified: F32.9

## 2016-03-24 HISTORY — DX: Gout, unspecified: M10.9

## 2016-03-24 HISTORY — DX: Unspecified dementia, unspecified severity, without behavioral disturbance, psychotic disturbance, mood disturbance, and anxiety: F03.90

## 2016-03-24 HISTORY — DX: Depression, unspecified: F32.A

## 2016-03-24 HISTORY — DX: Anxiety disorder, unspecified: F41.9

## 2016-03-24 LAB — URINALYSIS COMPLETE WITH MICROSCOPIC (ARMC ONLY)
BILIRUBIN URINE: NEGATIVE
GLUCOSE, UA: NEGATIVE mg/dL
Hgb urine dipstick: NEGATIVE
Ketones, ur: NEGATIVE mg/dL
Nitrite: NEGATIVE
Protein, ur: NEGATIVE mg/dL
Specific Gravity, Urine: 1.018 (ref 1.005–1.030)
pH: 5 (ref 5.0–8.0)

## 2016-03-24 LAB — PROTIME-INR
INR: 1
Prothrombin Time: 13.4 seconds (ref 11.4–15.0)

## 2016-03-24 LAB — COMPREHENSIVE METABOLIC PANEL
ALK PHOS: 107 U/L (ref 38–126)
ALT: 9 U/L — ABNORMAL LOW (ref 14–54)
ANION GAP: 7 (ref 5–15)
AST: 14 U/L — ABNORMAL LOW (ref 15–41)
Albumin: 3.9 g/dL (ref 3.5–5.0)
BUN: 46 mg/dL — ABNORMAL HIGH (ref 6–20)
CHLORIDE: 104 mmol/L (ref 101–111)
CO2: 27 mmol/L (ref 22–32)
Calcium: 9.7 mg/dL (ref 8.9–10.3)
Creatinine, Ser: 1.77 mg/dL — ABNORMAL HIGH (ref 0.44–1.00)
GFR calc non Af Amer: 26 mL/min — ABNORMAL LOW (ref >60)
GFR, EST AFRICAN AMERICAN: 30 mL/min — AB (ref >60)
Glucose, Bld: 116 mg/dL — ABNORMAL HIGH (ref 65–99)
Potassium: 3.2 mmol/L — ABNORMAL LOW (ref 3.5–5.1)
SODIUM: 138 mmol/L (ref 135–145)
Total Bilirubin: 0.6 mg/dL (ref 0.3–1.2)
Total Protein: 7.2 g/dL (ref 6.5–8.1)

## 2016-03-24 LAB — CBC
HCT: 36.9 % (ref 35.0–47.0)
Hemoglobin: 12.4 g/dL (ref 12.0–16.0)
MCH: 29.1 pg (ref 26.0–34.0)
MCHC: 33.7 g/dL (ref 32.0–36.0)
MCV: 86.2 fL (ref 80.0–100.0)
PLATELETS: 290 10*3/uL (ref 150–440)
RBC: 4.28 MIL/uL (ref 3.80–5.20)
RDW: 12.9 % (ref 11.5–14.5)
WBC: 6.4 10*3/uL (ref 3.6–11.0)

## 2016-03-24 LAB — APTT: aPTT: 28 seconds (ref 24–36)

## 2016-03-24 LAB — SURGICAL PCR SCREEN
MRSA, PCR: NEGATIVE
STAPHYLOCOCCUS AUREUS: NEGATIVE

## 2016-03-24 LAB — TYPE AND SCREEN
ABO/RH(D): A POS
Antibody Screen: NEGATIVE

## 2016-03-24 LAB — SEDIMENTATION RATE: SED RATE: 61 mm/h — AB (ref 0–30)

## 2016-03-24 NOTE — Patient Instructions (Signed)
  Your procedure is scheduled on: 04/05/16 Mon Report to Same Day Surgery 2nd floor medical mall To find out your arrival time please call [phone] between 1PM - 3PM on 04/02/18 Fri  Remember: Instructions that are not followed completely may result in serious medical risk, up to and including death, or upon the discretion of your surgeon and anesthesiologist your surgery may need to be rescheduled.    _x___ 1. Do not eat food or drink liquids after midnight. No gum chewing or hard candies.     ____ 2. No Alcohol for 24 hours before or after surgery.   ____ 3. Bring all medications with you on the day of surgery if instructed.    __x__ 4. Notify your doctor if there is any change in your medical condition     (cold, fever, infections).     Do not wear jewelry, make-up, hairpins, clips or nail polish.  Do not wear lotions, powders, or perfumes. You may wear deodorant.  Do not shave 48 hours prior to surgery. Men may shave face and neck.  Do not bring valuables to the hospital.    [facility] is not responsible for any belongings or valuables.               Contacts, dentures or bridgework may not be worn into surgery.  Leave your suitcase in the car. After surgery it may be brought to your room.  For patients admitted to the hospital, discharge time is determined by your treatment team.   Patients discharged the day of surgery will not be allowed to drive home.    Please read over the following fact sheets that you were given:   [facility] Preparing for Surgery and or MRSA Information   _x___ Take these medicines the morning of surgery with A SIP OF WATER:    1. losartan (COZAAR) 50 MG tablet  2.metoprolol (LOPRESSOR) 50 MG tablet  3.VAYACOG 100-19.5-6.5 MG CAPS  4.  5.  6.  ____ Fleet Enema (as directed)   _x___ Use CHG Soap or sage wipes as directed on instruction sheet   ____ Use inhalers on the day of surgery and bring to hospital day of surgery  ____ Stop  metformin 2 days prior to surgery    ____ Take 1/2 of usual insulin dose the night before surgery and none on the morning of           surgery.   ____ Stop aspirin or coumadin, or plavix  _x__ Stop Anti-inflammatories such as Advil, Aleve, Ibuprofen, Motrin, Naproxen,          Naprosyn, Goodies powders or aspirin products. Ok to take Tylenol.   ____ Stop supplements until after surgery.    ____ Bring C-Pap to the hospital.

## 2016-03-24 NOTE — Pre-Procedure Instructions (Signed)
Potassium 3.2 Dr Clydell Hakim nurse Margaretha Sheffield notified and lab result faxed to office.

## 2016-03-26 LAB — URINE CULTURE: SPECIAL REQUESTS: NORMAL

## 2016-03-26 NOTE — Pre-Procedure Instructions (Signed)
FAXED URINE CULTURE RESULTS TO DR Marry Guan

## 2016-03-29 ENCOUNTER — Telehealth: Payer: Self-pay

## 2016-03-29 DIAGNOSIS — E876 Hypokalemia: Secondary | ICD-10-CM

## 2016-03-29 NOTE — Telephone Encounter (Signed)
Patient's K+ level is low (3.53mmol/L) They requested PCP advise and treat  Per Dr. [name], patient to eat a banana or orange daily and come in on Thursday 04/01/16 for BMP

## 2016-03-30 ENCOUNTER — Telehealth: Payer: Self-pay

## 2016-03-30 NOTE — Telephone Encounter (Signed)
When I spoke to Katie Saunders about her mother eating a banana or an orange to increase her K+ She filled me in that she felt her mother's Dementia was getting progressively worse, she is getting more aggressive with her and her friends And she is loosing things frequently and having "panick attacks" to the point that Katie Saunders had to have the police go to the house to help her find her keys.  Katie Saunders is going to try to be with her mother at her next office visit but she wanted [place] to know about these changes.

## 2016-04-01 ENCOUNTER — Other Ambulatory Visit: Payer: Medicare Other

## 2016-04-01 DIAGNOSIS — E876 Hypokalemia: Secondary | ICD-10-CM

## 2016-04-02 ENCOUNTER — Telehealth: Payer: Self-pay | Admitting: Family Medicine

## 2016-04-02 LAB — BASIC METABOLIC PANEL
BUN/Creatinine Ratio: 20 (ref 12–28)
BUN: 32 mg/dL — AB (ref 8–27)
CALCIUM: 9.9 mg/dL (ref 8.7–10.3)
CHLORIDE: 103 mmol/L (ref 96–106)
CO2: 22 mmol/L (ref 18–29)
Creatinine, Ser: 1.59 mg/dL — ABNORMAL HIGH (ref 0.57–1.00)
GFR, EST AFRICAN AMERICAN: 35 mL/min/{1.73_m2} — AB (ref >59)
GFR, EST NON AFRICAN AMERICAN: 30 mL/min/{1.73_m2} — AB (ref >59)
Glucose: 115 mg/dL — ABNORMAL HIGH (ref 65–99)
Potassium: 3.9 mmol/L (ref 3.5–5.2)
Sodium: 141 mmol/L (ref 134–144)

## 2016-04-02 NOTE — Telephone Encounter (Signed)
Patient called.

## 2016-04-02 NOTE — Telephone Encounter (Signed)
Her potassium is back to normal! Keep eating your fruit! Please let her know.

## 2016-04-05 ENCOUNTER — Encounter: Payer: Self-pay | Admitting: Orthopedic Surgery

## 2016-04-05 ENCOUNTER — Inpatient Hospital Stay: Payer: Medicare Other | Admitting: Anesthesiology

## 2016-04-05 ENCOUNTER — Encounter
Admission: RE | Disposition: A | Discharge status: Skilled Nursing Facility | Payer: Self-pay | Source: Ambulatory Visit | Attending: Orthopedic Surgery

## 2016-04-05 ENCOUNTER — Inpatient Hospital Stay: Payer: Medicare Other

## 2016-04-05 ENCOUNTER — Inpatient Hospital Stay
Admission: RE | Admit: 2016-04-05 | Discharge: 2016-04-08 | DRG: 470 | Disposition: A | Discharge status: Skilled Nursing Facility | Payer: Medicare Other | Source: Ambulatory Visit | Attending: Orthopedic Surgery | Admitting: Orthopedic Surgery

## 2016-04-05 DIAGNOSIS — Z96659 Presence of unspecified artificial knee joint: Secondary | ICD-10-CM

## 2016-04-05 DIAGNOSIS — Z79899 Other long term (current) drug therapy: Secondary | ICD-10-CM | POA: Diagnosis not present

## 2016-04-05 DIAGNOSIS — E785 Hyperlipidemia, unspecified: Secondary | ICD-10-CM | POA: Diagnosis present

## 2016-04-05 DIAGNOSIS — M810 Age-related osteoporosis without current pathological fracture: Secondary | ICD-10-CM | POA: Diagnosis present

## 2016-04-05 DIAGNOSIS — N184 Chronic kidney disease, stage 4 (severe): Secondary | ICD-10-CM | POA: Diagnosis present

## 2016-04-05 DIAGNOSIS — H409 Unspecified glaucoma: Secondary | ICD-10-CM | POA: Diagnosis present

## 2016-04-05 DIAGNOSIS — M1712 Unilateral primary osteoarthritis, left knee: Principal | ICD-10-CM | POA: Diagnosis present

## 2016-04-05 DIAGNOSIS — M109 Gout, unspecified: Secondary | ICD-10-CM | POA: Diagnosis present

## 2016-04-05 DIAGNOSIS — F0390 Unspecified dementia without behavioral disturbance: Secondary | ICD-10-CM | POA: Diagnosis present

## 2016-04-05 DIAGNOSIS — Z9010 Acquired absence of unspecified breast and nipple: Secondary | ICD-10-CM

## 2016-04-05 DIAGNOSIS — Z853 Personal history of malignant neoplasm of breast: Secondary | ICD-10-CM | POA: Diagnosis not present

## 2016-04-05 DIAGNOSIS — I129 Hypertensive chronic kidney disease with stage 1 through stage 4 chronic kidney disease, or unspecified chronic kidney disease: Secondary | ICD-10-CM | POA: Diagnosis present

## 2016-04-05 DIAGNOSIS — Z96652 Presence of left artificial knee joint: Secondary | ICD-10-CM

## 2016-04-05 HISTORY — PX: KNEE ARTHROPLASTY: SHX992

## 2016-04-05 LAB — POCT I-STAT 4, (NA,K, GLUC, HGB,HCT)
GLUCOSE: 105 mg/dL — AB (ref 65–99)
HEMATOCRIT: 36 % (ref 36.0–46.0)
HEMOGLOBIN: 12.2 g/dL (ref 12.0–15.0)
Potassium: 3.8 mmol/L (ref 3.5–5.1)
SODIUM: 143 mmol/L (ref 135–145)

## 2016-04-05 SURGERY — ARTHROPLASTY, KNEE, TOTAL, USING IMAGELESS COMPUTER-ASSISTED NAVIGATION
Anesthesia: Spinal | Site: Knee | Laterality: Left | Wound class: Clean

## 2016-04-05 MED ORDER — NEOMYCIN-POLYMYXIN B GU 40-200000 IR SOLN
Status: AC
  Filled 2016-04-05: qty 20

## 2016-04-05 MED ORDER — DONEPEZIL HCL 5 MG PO TABS
5.0000 mg | ORAL_TABLET | Freq: Every day | ORAL | Status: DC
  Administered 2016-04-05 – 2016-04-07 (×3): 5 mg via ORAL
  Filled 2016-04-05 (×3): qty 1

## 2016-04-05 MED ORDER — SODIUM CHLORIDE 0.9 % IJ SOLN
INTRAMUSCULAR | Status: AC
  Filled 2016-04-05: qty 50

## 2016-04-05 MED ORDER — BUPIVACAINE LIPOSOME 1.3 % IJ SUSP
INTRAMUSCULAR | Status: DC | PRN
  Administered 2016-04-05: 60 mL

## 2016-04-05 MED ORDER — ACETAMINOPHEN 650 MG RE SUPP: 650.0000 mg | Freq: Four times a day (QID) | RECTAL | Status: DC | PRN

## 2016-04-05 MED ORDER — BRIMONIDINE TARTRATE-TIMOLOL 0.2-0.5 % OP SOLN: 1.0000 [drp] | Freq: Two times a day (BID) | OPHTHALMIC | Status: DC

## 2016-04-05 MED ORDER — FLEET ENEMA 7-19 GM/118ML RE ENEM: 1.0000 | ENEMA | Freq: Once | RECTAL | Status: DC | PRN

## 2016-04-05 MED ORDER — ONDANSETRON HCL 4 MG/2ML IJ SOLN
INTRAMUSCULAR | Status: DC | PRN
  Administered 2016-04-05: 4 mg via INTRAVENOUS

## 2016-04-05 MED ORDER — OXYCODONE HCL 5 MG PO TABS
5.0000 mg | ORAL_TABLET | ORAL | Status: DC | PRN
  Administered 2016-04-05 – 2016-04-07 (×2): 5 mg via ORAL
  Filled 2016-04-05 (×2): qty 1

## 2016-04-05 MED ORDER — PHENOL 1.4 % MT LIQD: 1.0000 | OROMUCOSAL | Status: DC | PRN

## 2016-04-05 MED ORDER — ACETAMINOPHEN 10 MG/ML IV SOLN
1000.0000 mg | Freq: Four times a day (QID) | INTRAVENOUS | Status: AC
  Administered 2016-04-05 – 2016-04-06 (×4): 1000 mg via INTRAVENOUS
  Filled 2016-04-05 (×4): qty 100

## 2016-04-05 MED ORDER — BISACODYL 10 MG RE SUPP: 10.0000 mg | Freq: Every day | RECTAL | Status: DC | PRN

## 2016-04-05 MED ORDER — PROPOFOL 10 MG/ML IV BOLUS
INTRAVENOUS | Status: DC | PRN
  Administered 2016-04-05: 30 mg via INTRAVENOUS
  Administered 2016-04-05: 20 mg via INTRAVENOUS

## 2016-04-05 MED ORDER — TIMOLOL MALEATE 0.5 % OP SOLN
1.0000 [drp] | Freq: Two times a day (BID) | OPHTHALMIC | Status: DC
  Administered 2016-04-05 – 2016-04-08 (×6): 1 [drp] via OPHTHALMIC
  Filled 2016-04-05: qty 5

## 2016-04-05 MED ORDER — CEFAZOLIN SODIUM-DEXTROSE 2-4 GM/100ML-% IV SOLN
2.0000 g | INTRAVENOUS | Status: AC
  Administered 2016-04-05: 2 g via INTRAVENOUS

## 2016-04-05 MED ORDER — BUPIVACAINE LIPOSOME 1.3 % IJ SUSP
INTRAMUSCULAR | Status: AC
  Filled 2016-04-05: qty 20

## 2016-04-05 MED ORDER — ONDANSETRON HCL 4 MG/2ML IJ SOLN: 4.0000 mg | Freq: Four times a day (QID) | INTRAMUSCULAR | Status: DC | PRN

## 2016-04-05 MED ORDER — MIDAZOLAM HCL 5 MG/5ML IJ SOLN
INTRAMUSCULAR | Status: DC | PRN
  Administered 2016-04-05: 1 mg via INTRAVENOUS

## 2016-04-05 MED ORDER — BUPIVACAINE HCL (PF) 0.5 % IJ SOLN
INTRAMUSCULAR | Status: DC | PRN
  Administered 2016-04-05: 3 mL

## 2016-04-05 MED ORDER — FERROUS SULFATE 325 (65 FE) MG PO TABS
325.0000 mg | ORAL_TABLET | Freq: Two times a day (BID) | ORAL | Status: DC
  Administered 2016-04-05 – 2016-04-08 (×6): 325 mg via ORAL
  Filled 2016-04-05 (×6): qty 1

## 2016-04-05 MED ORDER — BUPIVACAINE-EPINEPHRINE (PF) 0.25% -1:200000 IJ SOLN
INTRAMUSCULAR | Status: AC
  Filled 2016-04-05: qty 30

## 2016-04-05 MED ORDER — PHOSPHATIDYLSERINE-DHA-EPA 100-19.5-6.5 MG PO CAPS: 1.0000 | ORAL_CAPSULE | Freq: Every day | ORAL | Status: DC

## 2016-04-05 MED ORDER — FAMOTIDINE 20 MG PO TABS
20.0000 mg | ORAL_TABLET | Freq: Once | ORAL | Status: AC
  Administered 2016-04-05: 20 mg via ORAL

## 2016-04-05 MED ORDER — SODIUM CHLORIDE FLUSH 0.9 % IV SOLN
INTRAVENOUS | Status: AC
  Filled 2016-04-05: qty 10

## 2016-04-05 MED ORDER — ACETAMINOPHEN 10 MG/ML IV SOLN
INTRAVENOUS | Status: DC | PRN
  Administered 2016-04-05: 1000 mg via INTRAVENOUS

## 2016-04-05 MED ORDER — SODIUM CHLORIDE (HYPERTONIC) 2 % OP SOLN
1.0000 [drp] | OPHTHALMIC | Status: DC | PRN
  Filled 2016-04-05: qty 15

## 2016-04-05 MED ORDER — HYDROCHLOROTHIAZIDE 25 MG PO TABS
25.0000 mg | ORAL_TABLET | Freq: Every day | ORAL | Status: DC
  Administered 2016-04-06 – 2016-04-08 (×3): 25 mg via ORAL
  Filled 2016-04-05 (×3): qty 1

## 2016-04-05 MED ORDER — CEFAZOLIN SODIUM-DEXTROSE 2-4 GM/100ML-% IV SOLN
INTRAVENOUS | Status: AC
  Filled 2016-04-05: qty 100

## 2016-04-05 MED ORDER — NEOMYCIN-POLYMYXIN B GU 40-200000 IR SOLN
Status: DC | PRN
  Administered 2016-04-05: 14 mL

## 2016-04-05 MED ORDER — METOCLOPRAMIDE HCL 10 MG PO TABS
10.0000 mg | ORAL_TABLET | Freq: Three times a day (TID) | ORAL | Status: AC
  Administered 2016-04-05 – 2016-04-07 (×7): 10 mg via ORAL
  Filled 2016-04-05 (×7): qty 1

## 2016-04-05 MED ORDER — TRANEXAMIC ACID 1000 MG/10ML IV SOLN
1000.0000 mg | INTRAVENOUS | Status: AC
  Administered 2016-04-05: 1000 mg via INTRAVENOUS
  Filled 2016-04-05: qty 10

## 2016-04-05 MED ORDER — LACTATED RINGERS IV SOLN
INTRAVENOUS | Status: DC
  Administered 2016-04-05 (×2): via INTRAVENOUS

## 2016-04-05 MED ORDER — CHLORHEXIDINE GLUCONATE 4 % EX LIQD: 60.0000 mL | Freq: Once | CUTANEOUS | Status: DC

## 2016-04-05 MED ORDER — CELECOXIB 200 MG PO CAPS
200.0000 mg | ORAL_CAPSULE | Freq: Two times a day (BID) | ORAL | Status: DC
  Administered 2016-04-05 – 2016-04-08 (×7): 200 mg via ORAL
  Filled 2016-04-05 (×7): qty 1

## 2016-04-05 MED ORDER — DIPHENHYDRAMINE HCL 12.5 MG/5ML PO ELIX
12.5000 mg | ORAL_SOLUTION | ORAL | Status: DC | PRN
  Administered 2016-04-05 – 2016-04-06 (×2): 25 mg via ORAL
  Filled 2016-04-05 (×2): qty 10

## 2016-04-05 MED ORDER — FAMOTIDINE 20 MG PO TABS
ORAL_TABLET | ORAL | Status: AC
  Administered 2016-04-05: 20 mg via ORAL
  Filled 2016-04-05: qty 1

## 2016-04-05 MED ORDER — LOSARTAN POTASSIUM 50 MG PO TABS
50.0000 mg | ORAL_TABLET | Freq: Every day | ORAL | Status: DC
  Administered 2016-04-06 – 2016-04-08 (×3): 50 mg via ORAL
  Filled 2016-04-05 (×3): qty 1

## 2016-04-05 MED ORDER — LATANOPROST 0.005 % OP SOLN
1.0000 [drp] | Freq: Every day | OPHTHALMIC | Status: DC
Start: 2016-04-05 — End: 2016-04-08
  Administered 2016-04-05 – 2016-04-07 (×2): 1 [drp] via OPHTHALMIC
  Filled 2016-04-05: qty 2.5

## 2016-04-05 MED ORDER — TRAMADOL HCL 50 MG PO TABS
50.0000 mg | ORAL_TABLET | ORAL | Status: DC | PRN
  Administered 2016-04-05: 50 mg via ORAL
  Filled 2016-04-05: qty 1

## 2016-04-05 MED ORDER — BRIMONIDINE TARTRATE 0.2 % OP SOLN
1.0000 [drp] | Freq: Two times a day (BID) | OPHTHALMIC | Status: DC
  Administered 2016-04-05 – 2016-04-08 (×5): 1 [drp] via OPHTHALMIC
  Filled 2016-04-05: qty 5

## 2016-04-05 MED ORDER — ACETAMINOPHEN 10 MG/ML IV SOLN
INTRAVENOUS | Status: AC
  Filled 2016-04-05: qty 100

## 2016-04-05 MED ORDER — SENNOSIDES-DOCUSATE SODIUM 8.6-50 MG PO TABS
1.0000 | ORAL_TABLET | Freq: Two times a day (BID) | ORAL | Status: DC
  Administered 2016-04-05 – 2016-04-08 (×7): 1 via ORAL
  Filled 2016-04-05 (×7): qty 1

## 2016-04-05 MED ORDER — MENTHOL 3 MG MT LOZG
1.0000 | LOZENGE | OROMUCOSAL | Status: DC | PRN
Start: 2016-04-05 — End: 2016-04-08

## 2016-04-05 MED ORDER — PROPOFOL 500 MG/50ML IV EMUL
INTRAVENOUS | Status: DC | PRN
  Administered 2016-04-05: 25 ug/kg/min via INTRAVENOUS

## 2016-04-05 MED ORDER — ONDANSETRON HCL 4 MG/2ML IJ SOLN: 4.0000 mg | Freq: Once | INTRAMUSCULAR | Status: DC | PRN

## 2016-04-05 MED ORDER — CEFAZOLIN SODIUM-DEXTROSE 2-4 GM/100ML-% IV SOLN
2.0000 g | Freq: Four times a day (QID) | INTRAVENOUS | Status: AC
  Administered 2016-04-05 – 2016-04-06 (×4): 2 g via INTRAVENOUS
  Filled 2016-04-05 (×4): qty 100

## 2016-04-05 MED ORDER — SODIUM CHLORIDE 0.9 % IV SOLN
INTRAVENOUS | Status: DC
  Administered 2016-04-05: 16:00:00 via INTRAVENOUS

## 2016-04-05 MED ORDER — MORPHINE SULFATE (PF) 2 MG/ML IV SOLN: 2.0000 mg | INTRAVENOUS | Status: DC | PRN

## 2016-04-05 MED ORDER — DORZOLAMIDE HCL 2 % OP SOLN
1.0000 [drp] | Freq: Two times a day (BID) | OPHTHALMIC | Status: DC
  Administered 2016-04-05 – 2016-04-08 (×6): 1 [drp] via OPHTHALMIC
  Filled 2016-04-05: qty 10

## 2016-04-05 MED ORDER — METOPROLOL TARTRATE 50 MG PO TABS
50.0000 mg | ORAL_TABLET | Freq: Two times a day (BID) | ORAL | Status: DC
  Administered 2016-04-05 – 2016-04-08 (×6): 50 mg via ORAL
  Filled 2016-04-05 (×6): qty 1

## 2016-04-05 MED ORDER — ALUM & MAG HYDROXIDE-SIMETH 200-200-20 MG/5ML PO SUSP: 30.0000 mL | ORAL | Status: DC | PRN

## 2016-04-05 MED ORDER — TRANEXAMIC ACID 1000 MG/10ML IV SOLN
1000.0000 mg | Freq: Once | INTRAVENOUS | Status: AC
  Administered 2016-04-05: 1000 mg via INTRAVENOUS
  Filled 2016-04-05: qty 10

## 2016-04-05 MED ORDER — ACETAMINOPHEN 325 MG PO TABS: 650.0000 mg | ORAL_TABLET | Freq: Four times a day (QID) | ORAL | Status: DC | PRN

## 2016-04-05 MED ORDER — ONDANSETRON HCL 4 MG PO TABS: 4.0000 mg | ORAL_TABLET | Freq: Four times a day (QID) | ORAL | Status: DC | PRN

## 2016-04-05 MED ORDER — MAGNESIUM HYDROXIDE 400 MG/5ML PO SUSP: 30.0000 mL | Freq: Every day | ORAL | Status: DC | PRN

## 2016-04-05 MED ORDER — ENOXAPARIN SODIUM 30 MG/0.3ML ~~LOC~~ SOLN
30.0000 mg | SUBCUTANEOUS | Status: DC
  Administered 2016-04-06 – 2016-04-08 (×3): 30 mg via SUBCUTANEOUS
  Filled 2016-04-05 (×3): qty 0.3

## 2016-04-05 MED ORDER — PANTOPRAZOLE SODIUM 40 MG PO TBEC
40.0000 mg | DELAYED_RELEASE_TABLET | Freq: Two times a day (BID) | ORAL | Status: DC
  Administered 2016-04-05 – 2016-04-08 (×7): 40 mg via ORAL
  Filled 2016-04-05 (×7): qty 1

## 2016-04-05 MED ORDER — FEBUXOSTAT 40 MG PO TABS
40.0000 mg | ORAL_TABLET | Freq: Every day | ORAL | Status: DC
  Administered 2016-04-05 – 2016-04-08 (×4): 40 mg via ORAL
  Filled 2016-04-05 (×4): qty 1

## 2016-04-05 MED ORDER — FENTANYL CITRATE (PF) 100 MCG/2ML IJ SOLN: 25.0000 ug | INTRAMUSCULAR | Status: DC | PRN

## 2016-04-05 MED ORDER — BUPIVACAINE-EPINEPHRINE 0.25% -1:200000 IJ SOLN
INTRAMUSCULAR | Status: DC | PRN
  Administered 2016-04-05: 30 mL

## 2016-04-05 SURGICAL SUPPLY — 57 items
AUTOTRANSFUS HAS 1/8 (MISCELLANEOUS) ×3
BATTERY INSTRU NAVIGATION (MISCELLANEOUS) ×12 IMPLANT
BLADE SAW 1 (BLADE) ×3 IMPLANT
BLADE SAW 1/2 (BLADE) ×3 IMPLANT
CANISTER SUCT 1200ML W/VALVE (MISCELLANEOUS) ×3 IMPLANT
CANISTER SUCT 3000ML (MISCELLANEOUS) ×6 IMPLANT
CAPT KNEE TOTAL 3 ATTUNE ×3 IMPLANT
CATH TRAY METER 16FR LF (MISCELLANEOUS) ×3 IMPLANT
CEMENT HV SMART SET (Cement) ×6 IMPLANT
COOLER POLAR GLACIER W/PUMP (MISCELLANEOUS) ×3 IMPLANT
CUFF TOURN 24 STER (MISCELLANEOUS) IMPLANT
CUFF TOURN 30 STER DUAL PORT (MISCELLANEOUS) ×3 IMPLANT
DRAPE SHEET LG 3/4 BI-LAMINATE (DRAPES) ×3 IMPLANT
DRSG DERMACEA 8X12 NADH (GAUZE/BANDAGES/DRESSINGS) ×3 IMPLANT
DRSG OPSITE POSTOP 4X14 (GAUZE/BANDAGES/DRESSINGS) ×3 IMPLANT
DRSG TEGADERM 4X4.75 (GAUZE/BANDAGES/DRESSINGS) ×3 IMPLANT
DURAPREP 26ML APPLICATOR (WOUND CARE) ×6 IMPLANT
ELECT CAUTERY BLADE 6.4 (BLADE) ×3 IMPLANT
ELECT REM PT RETURN 9FT ADLT (ELECTROSURGICAL) ×3
ELECTRODE REM PT RTRN 9FT ADLT (ELECTROSURGICAL) ×1 IMPLANT
EX-PIN ORTHOLOCK NAV 4X150 (PIN) ×6 IMPLANT
GLOVE BIOGEL M STRL SZ7.5 (GLOVE) ×6 IMPLANT
GLOVE INDICATOR 8.0 STRL GRN (GLOVE) ×3 IMPLANT
GLOVE SURG 9.0 ORTHO LTXF (GLOVE) ×3 IMPLANT
GLOVE SURG ORTHO 9.0 STRL STRW (GLOVE) ×3 IMPLANT
GOWN STRL REUS W/ TWL LRG LVL3 (GOWN DISPOSABLE) ×2 IMPLANT
GOWN STRL REUS W/TWL 2XL LVL3 (GOWN DISPOSABLE) ×3 IMPLANT
GOWN STRL REUS W/TWL LRG LVL3 (GOWN DISPOSABLE) ×4
HANDPIECE SUCTION TUBG SURGILV (MISCELLANEOUS) ×3 IMPLANT
HOLDER FOLEY CATH W/STRAP (MISCELLANEOUS) ×3 IMPLANT
HOOD PEEL AWAY FLYTE STAYCOOL (MISCELLANEOUS) ×6 IMPLANT
KIT RM TURNOVER STRD PROC AR (KITS) ×3 IMPLANT
KNIFE SCULPS 14X20 (INSTRUMENTS) ×3 IMPLANT
NDL SAFETY 18GX1.5 (NEEDLE) ×3 IMPLANT
NEEDLE SPNL 20GX3.5 QUINCKE YW (NEEDLE) ×3 IMPLANT
NS IRRIG 500ML POUR BTL (IV SOLUTION) ×3 IMPLANT
PACK TOTAL KNEE (MISCELLANEOUS) ×3 IMPLANT
PAD WRAPON POLAR KNEE (MISCELLANEOUS) ×1 IMPLANT
PIN FIXATION 1/8DIA X 3INL (PIN) ×3 IMPLANT
SOL .9 NS 3000ML IRR  AL (IV SOLUTION) ×2
SOL .9 NS 3000ML IRR UROMATIC (IV SOLUTION) ×1 IMPLANT
SOL PREP PVP 2OZ (MISCELLANEOUS) ×3
SOLUTION PREP PVP 2OZ (MISCELLANEOUS) ×1 IMPLANT
SPONGE DRAIN TRACH 4X4 STRL 2S (GAUZE/BANDAGES/DRESSINGS) ×3 IMPLANT
STAPLER SKIN PROX 35W (STAPLE) ×3 IMPLANT
SUCTION FRAZIER HANDLE 10FR (MISCELLANEOUS) ×2
SUCTION TUBE FRAZIER 10FR DISP (MISCELLANEOUS) ×1 IMPLANT
SUT VIC AB 0 CT1 36 (SUTURE) ×3 IMPLANT
SUT VIC AB 1 CT1 36 (SUTURE) ×6 IMPLANT
SUT VIC AB 2-0 CT2 27 (SUTURE) ×3 IMPLANT
SYR 20CC LL (SYRINGE) ×3 IMPLANT
SYR 30ML LL (SYRINGE) ×3 IMPLANT
SYR 50ML LL SCALE MARK (SYRINGE) ×3 IMPLANT
SYSTEM AUTOTRANSFUS DUAL TROCR (MISCELLANEOUS) ×1 IMPLANT
TOWEL OR 17X26 4PK STRL BLUE (TOWEL DISPOSABLE) ×3 IMPLANT
TOWER CARTRIDGE SMART MIX (DISPOSABLE) ×3 IMPLANT
WRAPON POLAR PAD KNEE (MISCELLANEOUS) ×3

## 2016-04-05 NOTE — Progress Notes (Addendum)
PHARMACIST - PHYSICIAN ORDER COMMUNICATION  CONCERNING: P&T Medication Policy on Herbal Medications  DESCRIPTION:  This patient's order for:  Phosphatidylserine-DHA-EPA   has been noted.  This product(s) is classified as an "herbal" or natural product. Due to a lack of definitive safety studies or FDA approval, nonstandard manufacturing practices, plus the potential risk of unknown drug-drug interactions while on inpatient medications, the Pharmacy and Therapeutics Committee does not permit the use of "herbal" or natural products of this type within [facility]   ACTION TAKEN: The pharmacy department is unable to verify this order at this time and your patient has been informed of this safety policy. Please reevaluate patient's clinical condition at discharge and address if the herbal or natural product(s) should be resumed at that time.

## 2016-04-05 NOTE — Progress Notes (Signed)
Anticoagulation monitoring(Lovenox):  82 yo  ordered Lovenox 30 mg Q12h  Filed Weights   04/05/16 1011  Weight: 159 lb (72.122 kg)   BMI  Lab Results  Component Value Date   CREATININE 1.59* 04/01/2016   CREATININE 1.77* 03/24/2016   CREATININE 1.53* 12/01/2015   Estimated Creatinine Clearance: 26.6 mL/min (by C-G formula based on Cr of 1.59). Hemoglobin & Hematocrit     Component Value Date/Time   HGB 12.2 04/05/2016 1001   HCT 36.0 04/05/2016 1001   HCT 34.9 12/01/2015 1357     Per Protocol for Patient with estCrcl< 30 ml/min and BMI < 40, will transition to Lovenox 30 mg Q24h.

## 2016-04-05 NOTE — Op Note (Signed)
OPERATIVE NOTE  DATE OF SURGERY:  04/05/2016  PATIENT NAME:  Katie Saunders   DOB: Feb 26, 1931  MRN: [id number]  PRE-OPERATIVE DIAGNOSIS: Degenerative arthrosis of the left knee, primary  POST-OPERATIVE DIAGNOSIS:  Same  PROCEDURE:  Left total knee arthroplasty using computer-assisted navigation  SURGEON:  Marciano Sequin. M.D.  ASSISTANT:  Vance Peper, PA (present and scrubbed throughout the case, critical for assistance with exposure, retraction, instrumentation, and closure)  ANESTHESIA: spinal  ESTIMATED BLOOD LOSS: 50 mL  FLUIDS REPLACED: 1300 mL of crystalloid  TOURNIQUET TIME: 81 minutes  DRAINS: 2 medium drains to a reinfusion system  SOFT TISSUE RELEASES: Anterior cruciate ligament, posterior cruciate ligament, deep medial collateral ligament, patellofemoral ligament   IMPLANTS UTILIZED: DePuy Attune size 5 posterior stabilized femoral component (cemented), size 5 rotating platform tibial component (cemented), 35 mm medialized dome patella (cemented), and a 6 mm stabilized rotating platform polyethylene insert.  INDICATIONS FOR SURGERY: Katie Saunders is a 82 y.o. year old female with a long history of progressive knee pain. X-rays demonstrated severe degenerative changes in tricompartmental fashion. The patient had not seen any significant improvement despite conservative nonsurgical intervention. After discussion of the risks and benefits of surgical intervention, the patient expressed understanding of the risks benefits and agree with plans for total knee arthroplasty.   The risks, benefits, and alternatives were discussed at length including but not limited to the risks of infection, bleeding, nerve injury, stiffness, blood clots, the need for revision surgery, cardiopulmonary complications, among others, and they were willing to proceed.  PROCEDURE IN DETAIL: The patient was brought into the operating room and, after adequate spinal anesthesia was achieved, a  tourniquet was placed on the patient's upper thigh. The patient's knee and leg were cleaned and prepped with alcohol and DuraPrep and draped in the usual sterile fashion. A "timeout" was performed as per usual protocol. The lower extremity was exsanguinated using an Esmarch, and the tourniquet was inflated to 300 mmHg. An anterior longitudinal incision was made followed by a standard mid vastus approach. The deep fibers of the medial collateral ligament were elevated in a subperiosteal fashion off of the medial flare of the tibia so as to maintain a continuous soft tissue sleeve. The patella was subluxed laterally and the patellofemoral ligament was incised. Inspection of the knee demonstrated severe degenerative changes with full-thickness loss of articular cartilage. Osteophytes were debrided using a rongeur. Anterior and posterior cruciate ligaments were excised. Two 4.0 mm Schanz pins were inserted in the femur and into the tibia for attachment of the array of trackers used for computer-assisted navigation. Hip center was identified using a circumduction technique. Distal landmarks were mapped using the computer. The distal femur and proximal tibia were mapped using the computer. The distal femoral cutting guide was positioned using computer-assisted navigation so as to achieve a 5 distal valgus cut. The femur was sized and it was felt that a size 5 femoral component was appropriate. A size 5 femoral cutting guide was positioned and the anterior cut was performed and verified using the computer. This was followed by completion of the posterior and chamfer cuts. Femoral cutting guide for the central box was then positioned in the center box cut was performed.  Attention was then directed to the proximal tibia. Medial and lateral menisci were excised. The extramedullary tibial cutting guide was positioned using computer-assisted navigation so as to achieve a 0 varus-valgus alignment and 3 posterior slope. The  cut was performed and verified using the  computer. The proximal tibia was sized and it was felt that a size 5 tibial tray was appropriate. Tibial and femoral trials were inserted followed by insertion of a 6 mm polyethylene insert. This allowed for excellent mediolateral soft tissue balancing both in flexion and in full extension. Finally, the patella was cut and prepared so as to accommodate a 35 mm medialized dome patella. A patella trial was placed and the knee was placed through a range of motion with excellent patellar tracking appreciated. The femoral trial was removed after debridement of posterior osteophytes. The central post-hole for the tibial component was reamed followed by insertion of a keel punch. Tibial trials were then removed. Cut surfaces of bone were irrigated with copious amounts of normal saline with antibiotic solution using pulsatile lavage and then suctioned dry. Polymethylmethacrylate cement was prepared in the usual fashion using a vacuum mixer. Cement was applied to the cut surface of the proximal tibia as well as along the undersurface of a size 5 rotating platform tibial component. Tibial component was positioned and impacted into place. Excess cement was removed using [employer] fast streamer. Cement was then applied to the cut surfaces of the femur as well as along the posterior flanges of the size 5 femoral component. The femoral component was positioned and impacted into place. Excess cement was removed using [employer] fast streamer. A 6 mm polyethylene trial was inserted and the knee was brought into full extension with steady axial compression applied. Finally, cement was applied to the backside of a 35 mm medialized dome patella and the patellar component was positioned and patellar clamp applied. Excess cement was removed using [employer] fast streamer. After adequate curing of the cement, the tourniquet was deflated after a total tourniquet time of 81 minutes. Hemostasis was achieved using  electrocautery. The knee was irrigated with copious amounts of normal saline with antibiotic solution using pulsatile lavage and then suctioned dry. 20 mL of 1.3% Exparel in 40 mL of normal saline was injected along the posterior capsule, medial and lateral gutters, and along the arthrotomy site. A 6 mm stabilized rotating platform polyethylene insert was inserted and the knee was placed through a range of motion with excellent mediolateral soft tissue balancing appreciated and excellent patellar tracking noted. 2 medium drains were placed in the wound bed and brought out through separate stab incisions to be attached to a reinfusion system. The medial parapatellar portion of the incision was reapproximated using interrupted sutures of #1 Vicryl. Subcutaneous tissue was then injected with a total of 30 cc of 0.25% Marcaine with epinephrine. Subcutaneous tissue was approximated in layers using first #0 Vicryl followed #2-0 Vicryl. The skin was approximated with skin staples. A sterile dressing was applied.  The patient tolerated the procedure well and was transported to the recovery room in stable condition.    James P. Holley Bouche., M.D.

## 2016-04-05 NOTE — Brief Op Note (Signed)
04/05/2016  2:34 PM  PATIENT:  Katie Saunders  82 y.o. female  PRE-OPERATIVE DIAGNOSIS:  primary osteoarthritis of the left knee  POST-OPERATIVE DIAGNOSIS:  Same  PROCEDURE:  Procedure(s): COMPUTER ASSISTED TOTAL KNEE ARTHROPLASTY (Left)  SURGEON:  Surgeon(s) and Role:    * Dereck Leep, MD - Primary  ASSISTANTS: Vance Peper, PA   ANESTHESIA:   spinal  EBL:  Total I/O In: 1000 [I.V.:1000] Out: 175 [Urine:125; Blood:50]  BLOOD ADMINISTERED:none  DRAINS: 2 medium drains to a reinfusion system   LOCAL MEDICATIONS USED:  MARCAINE    and OTHER Exparel  SPECIMEN:  No Specimen  DISPOSITION OF SPECIMEN:  N/A  COUNTS:  YES  TOURNIQUET:   81 minutes  DICTATION: .Dragon Dictation  PLAN OF CARE: Admit to inpatient   PATIENT DISPOSITION:  PACU - hemodynamically stable.   Delay start of Pharmacological VTE agent (>24hrs) due to surgical blood loss or risk of bleeding: yes

## 2016-04-05 NOTE — Anesthesia Procedure Notes (Signed)
Spinal Patient location during procedure: OR Start time: 04/05/2016 11:16 AM End time: 04/05/2016 11:21 AM Staffing Anesthesiologist: Gunnar Fusi Resident/CRNA: Morgan Keinath Performed by: resident/CRNA  Preanesthetic Checklist Completed: patient identified, site marked, surgical consent, pre-op evaluation, IV checked, risks and benefits discussed and monitors and equipment checked Spinal Block Patient position: sitting Prep: Betadine Patient monitoring: heart rate, continuous pulse ox and blood pressure Approach: midline Location: L3-4 Injection technique: single-shot Needle Needle type: Whitacre  Needle gauge: 25 G

## 2016-04-05 NOTE — H&P (Signed)
The patient has been re-examined, and the chart reviewed, and there have been no interval changes to the documented history and physical.    The risks, benefits, and alternatives have been discussed at length. The patient expressed understanding of the risks benefits and agreed with plans for surgical intervention.  James P. Hooten, Jr. M.D.    

## 2016-04-05 NOTE — Anesthesia Preprocedure Evaluation (Signed)
Anesthesia Evaluation  Patient identified by MRN, date of birth, ID band Patient awake    Reviewed: Allergy & Precautions, NPO status , Patient's Chart, lab work & pertinent test results  History of Anesthesia Complications Negative for: history of anesthetic complications  Airway Mallampati: III       Dental   Pulmonary neg pulmonary ROS,           Cardiovascular hypertension, Pt. on medications and Pt. on home beta blockers      Neuro/Psych Anxiety Depression negative neurological ROS     GI/Hepatic negative GI ROS, Neg liver ROS,   Endo/Other  negative endocrine ROS  Renal/GU Renal Insufficiencynegative Renal ROS     Musculoskeletal   Abdominal   Peds  Hematology   Anesthesia Other Findings   Reproductive/Obstetrics                             Anesthesia Physical Anesthesia Plan  ASA: II  Anesthesia Plan: Spinal   Post-op Pain Management:    Induction: Intravenous  Airway Management Planned: Nasal Cannula  Additional Equipment:   Intra-op Plan:   Post-operative Plan:   Informed Consent: I have reviewed the patients History and Physical, chart, labs and discussed the procedure including the risks, benefits and alternatives for the proposed anesthesia with the patient or authorized representative who has indicated his/her understanding and acceptance.     Plan Discussed with:   Anesthesia Plan Comments:         Anesthesia Quick Evaluation

## 2016-04-05 NOTE — Transfer of Care (Signed)
Immediate Anesthesia Transfer of Care Note  Patient: Katie Saunders  Procedure(s) Performed: Procedure(s): COMPUTER ASSISTED TOTAL KNEE ARTHROPLASTY (Left)  Patient Location: PACU  Anesthesia Type:Spinal  Level of Consciousness: awake  Airway & Oxygen Therapy: Patient Spontanous Breathing and Patient connected to face mask oxygen  Post-op Assessment: Post -op Vital signs reviewed and stable  Post vital signs: stable  Last Vitals:  Filed Vitals:   04/05/16 1011 04/05/16 1435  BP: 147/72 119/62  Pulse: 73 66  Temp: 36.2 C 37.1 C  Resp: 16 22    Last Pain: There were no vitals filed for this visit.       Complications: No apparent anesthesia complications

## 2016-04-05 NOTE — NC FL2 (Signed)
[place] LEVEL OF CARE SCREENING TOOL     IDENTIFICATION  Patient Name: Katie Saunders Birthdate: [birth year] Sex: female Admission Date (Current Location): 04/05/2016  [place] and [place] Number:  Engineering geologist and Address:  [facility], [address], [place], [place] [address]      Provider Number: [id number]  Attending Physician Name and Address:  Dereck Leep, MD  Relative Name and Phone Number:       Current Level of Care: Hospital Recommended Level of Care: [place] Prior Approval Number:    Date Approved/Denied:   PASRR Number:  ([id number])  Discharge Plan: SNF    Current Diagnoses: Patient Active Problem List   Diagnosis Date Noted  . S/P total knee arthroplasty 04/05/2016  . Essential hypertension 12/01/2015  . Gout 09/22/2015  . Dementia 09/22/2015  . Glaucoma 05/26/2015  . Hyperlipidemia   . CKD stage 4 secondary to hypertension (HCC)     Orientation RESPIRATION BLADDER Height & Weight     Self, Time, Situation, Place  Normal Continent Weight: 159 lb (72.122 kg) Height:  5\' 4"  (162.6 cm)  BEHAVIORAL SYMPTOMS/MOOD NEUROLOGICAL BOWEL NUTRITION STATUS   (none )  (none ) Continent Diet (Diet: Clear Liquid )  AMBULATORY STATUS COMMUNICATION OF NEEDS Skin   Extensive Assist Verbally Surgical wounds (Incision: Left Knee )                       Personal Care Assistance Level of Assistance  Bathing, Feeding, Dressing Bathing Assistance: Limited assistance Feeding assistance: Independent Dressing Assistance: Limited assistance     Functional Limitations Info  Sight, Hearing, Speech Sight Info: Adequate Hearing Info: Adequate Speech Info: Adequate    SPECIAL CARE FACTORS FREQUENCY  PT (By licensed PT), OT (By licensed OT)     PT Frequency:  (5) OT Frequency:  (5)            Contractures      Additional Factors Info  Code Status, Allergies Code Status Info:  (Full  Code. ) Allergies Info:  (No Known Allergies. )           Current Medications (04/05/2016):  This is the current hospital active medication list Current Facility-Administered Medications  Medication Dose Route Frequency Provider Last Rate Last Dose  . 0.9 %  sodium chloride infusion   Intravenous Continuous Dereck Leep, MD 100 mL/hr at 04/05/16 1554    . acetaminophen (OFIRMEV) IV 1,000 mg  1,000 mg Intravenous Q6H Dereck Leep, MD      . acetaminophen (TYLENOL) tablet 650 mg  650 mg Oral Q6H PRN Dereck Leep, MD       Or  . acetaminophen (TYLENOL) suppository 650 mg  650 mg Rectal Q6H PRN Dereck Leep, MD      . alum & mag hydroxide-simeth (MAALOX/MYLANTA) 200-200-20 MG/5ML suspension 30 mL  30 mL Oral Q4H PRN Dereck Leep, MD      . bisacodyl (DULCOLAX) suppository 10 mg  10 mg Rectal Daily PRN Dereck Leep, MD      . brimonidine (ALPHAGAN) 0.2 % ophthalmic solution 1 drop  1 drop Both Eyes Q12H Dereck Leep, MD       And  . timolol (TIMOPTIC) 0.5 % ophthalmic solution 1 drop  1 drop Both Eyes Q12H Dereck Leep, MD      . ceFAZolin (ANCEF) 2-4 GM/100ML-% IVPB           .  ceFAZolin (ANCEF) IVPB 2g/100 mL premix  2 g Intravenous Q6H Dereck Leep, MD      . celecoxib (CELEBREX) capsule 200 mg  200 mg Oral Q12H Dereck Leep, MD      . diphenhydrAMINE (BENADRYL) 12.5 MG/5ML elixir 12.5-25 mg  12.5-25 mg Oral Q4H PRN Dereck Leep, MD      . donepezil (ARICEPT) tablet 5 mg  5 mg Oral QHS Dereck Leep, MD      . dorzolamide (TRUSOPT) 2 % ophthalmic solution 1 drop  1 drop Left Eye BID Dereck Leep, MD      . Derrill Memo ON 04/06/2016] enoxaparin (LOVENOX) injection 30 mg  30 mg Subcutaneous Q24H Dereck Leep, MD      . febuxostat (ULORIC) tablet 40 mg  40 mg Oral Daily Dereck Leep, MD      . ferrous sulfate tablet 325 mg  325 mg Oral BID WC Dereck Leep, MD      . Derrill Memo ON 04/06/2016] hydrochlorothiazide (HYDRODIURIL) tablet 25 mg  25 mg Oral Daily Dereck Leep, MD       . latanoprost (XALATAN) 0.005 % ophthalmic solution 1 drop  1 drop Both Eyes QHS Dereck Leep, MD      . Derrill Memo ON 04/06/2016] losartan (COZAAR) tablet 50 mg  50 mg Oral Daily Dereck Leep, MD      . magnesium hydroxide (MILK OF MAGNESIA) suspension 30 mL  30 mL Oral Daily PRN Dereck Leep, MD      . menthol-cetylpyridinium (CEPACOL) lozenge 3 mg  1 lozenge Oral PRN Dereck Leep, MD       Or  . phenol (CHLORASEPTIC) mouth spray 1 spray  1 spray Mouth/Throat PRN Dereck Leep, MD      . metoCLOPramide (REGLAN) tablet 10 mg  10 mg Oral TID AC & HS Dereck Leep, MD      . metoprolol (LOPRESSOR) tablet 50 mg  50 mg Oral BID Dereck Leep, MD      . morphine 2 MG/ML injection 2 mg  2 mg Intravenous Q2H PRN Dereck Leep, MD      . ondansetron (ZOFRAN) tablet 4 mg  4 mg Oral Q6H PRN Dereck Leep, MD       Or  . ondansetron (ZOFRAN) injection 4 mg  4 mg Intravenous Q6H PRN Dereck Leep, MD      . oxyCODONE (Oxy IR/ROXICODONE) immediate release tablet 5-10 mg  5-10 mg Oral Q4H PRN Dereck Leep, MD      . pantoprazole (PROTONIX) EC tablet 40 mg  40 mg Oral BID Dereck Leep, MD      . senna-docusate (Senokot-S) tablet 1 tablet  1 tablet Oral BID Dereck Leep, MD      . sodium chloride (MURO 128) 2 % ophthalmic solution 1 drop  1 drop Both Eyes PRN Dereck Leep, MD      . sodium chloride flush 0.9 % injection           . sodium phosphate (FLEET) 7-19 GM/118ML enema 1 enema  1 enema Rectal Once PRN Dereck Leep, MD      . traMADol Veatrice Bourbon) tablet 50-100 mg  50-100 mg Oral Q4H PRN Dereck Leep, MD         Discharge Medications: Please see discharge summary for a list of discharge medications.  Relevant Imaging Results:  Relevant Lab Results:   Additional Information  (  SSN: [id number])  Loralyn Freshwater, LCSW

## 2016-04-06 LAB — BASIC METABOLIC PANEL
Anion gap: 5 (ref 5–15)
BUN: 26 mg/dL — AB (ref 6–20)
CHLORIDE: 109 mmol/L (ref 101–111)
CO2: 23 mmol/L (ref 22–32)
CREATININE: 1.56 mg/dL — AB (ref 0.44–1.00)
Calcium: 8.2 mg/dL — ABNORMAL LOW (ref 8.9–10.3)
GFR calc Af Amer: 35 mL/min — ABNORMAL LOW (ref >60)
GFR calc non Af Amer: 30 mL/min — ABNORMAL LOW (ref >60)
GLUCOSE: 103 mg/dL — AB (ref 65–99)
Potassium: 3.6 mmol/L (ref 3.5–5.1)
SODIUM: 137 mmol/L (ref 135–145)

## 2016-04-06 LAB — CBC
HEMATOCRIT: 29.1 % — AB (ref 35.0–47.0)
HEMOGLOBIN: 10 g/dL — AB (ref 12.0–16.0)
MCH: 29.6 pg (ref 26.0–34.0)
MCHC: 34.2 g/dL (ref 32.0–36.0)
MCV: 86.6 fL (ref 80.0–100.0)
Platelets: 217 10*3/uL (ref 150–440)
RBC: 3.36 MIL/uL — ABNORMAL LOW (ref 3.80–5.20)
RDW: 13 % (ref 11.5–14.5)
WBC: 8.1 10*3/uL (ref 3.6–11.0)

## 2016-04-06 MED ORDER — ENOXAPARIN SODIUM 30 MG/0.3ML ~~LOC~~ SOLN: 30.0000 mg | SUBCUTANEOUS | Status: DC

## 2016-04-06 MED ORDER — OXYCODONE HCL 5 MG PO TABS: 5.0000 mg | ORAL_TABLET | ORAL | Status: DC | PRN

## 2016-04-06 MED ORDER — TRAMADOL HCL 50 MG PO TABS: 50.0000 mg | ORAL_TABLET | ORAL | Status: DC | PRN

## 2016-04-06 NOTE — Anesthesia Postprocedure Evaluation (Signed)
Anesthesia Post Note  Patient: Katie Saunders  Procedure(s) Performed: Procedure(s) (LRB): COMPUTER ASSISTED TOTAL KNEE ARTHROPLASTY (Left)  Patient location during evaluation: Nursing Unit Anesthesia Type: Spinal Level of consciousness: awake and alert Pain management: pain level controlled Vital Signs Assessment: post-procedure vital signs reviewed and stable Respiratory status: spontaneous breathing Cardiovascular status: stable Postop Assessment: adequate PO intake and no signs of nausea or vomiting Anesthetic complications: no    Last Vitals:  Filed Vitals:   04/05/16 2333 04/06/16 0400  BP: 145/61 144/60  Pulse: 65 64  Temp: 36.9 C 36.7 C  Resp: 18 18    Last Pain:  Filed Vitals:   04/06/16 0620  PainSc: Asleep                 Estill Batten

## 2016-04-06 NOTE — Progress Notes (Signed)
Physical Therapy Evaluation Patient Details Name: Katie Saunders MRN: [id number] DOB: [birth year] Today's Date: 04/06/2016   History of Present Illness  Pt is an 82 yr old female s/p elective L TKA 04/05/16. PMH significant for HTN, gout, dementia, hyperlipidemia, CKD, and L breast CA.    Clinical Impression  Prior to admission, pt was mod I versus I with functional mobility, using a SPC or RW intermittently for stability. Pt lives alone in a two-story home (able to live on main level) with 2 steps for entry. Currently, pt is supervision for bed mobility and CGA for sit<>stand transfers and ambulation x20' with RW. Pt has impulsivity and decreased safety awareness as evidenced by attempting to get up without assist and improper DME use. Pt would benefit from skilled PT to address noted impairments and functional limitations. Pt is at risk for falls, and because family is unavailable to provide 24/7 supervision, I recommend pt discharge to SNF (pending progress) when medically appropriate.     Follow Up Recommendations SNF (pending progress)    Equipment Recommendations  Rolling walker with 5" wheels    Recommendations for Other Services       Precautions / Restrictions Precautions Precautions: Knee;Fall Precaution Booklet Issued: Yes (comment) Restrictions Weight Bearing Restrictions: Yes LLE Weight Bearing: Weight bearing as tolerated      Mobility  Bed Mobility Overal bed mobility: Needs Assistance Bed Mobility: Supine to Sit Supine to sit: Supervision;HOB elevated   General bed mobility comments: Vc's for technique and safety. CGA to close SBA while sitting EOB.  Transfers Overall transfer level: Needs assistance Equipment used: Rolling walker (2 wheeled) Transfers: Sit to/from Stand Sit to Stand: Min guard    General transfer comment: Pt assumes stand in flexed posturing, then initiates hip/knee extension. Vc's for DME use, sequencing, and  posture.  Ambulation/Gait Ambulation/Gait assistance: Min guard Ambulation Distance (Feet): 20 Feet Assistive device: Rolling walker (2 wheeled) Gait Pattern/deviations: Step-to pattern;Decreased step length - right;Decreased step length - left;Decreased stance time - left Gait velocity: decreased   General Gait Details: Vc's for sequencing and proper DME use (does not stay within RW with turning)  Stairs     Wheelchair Mobility    Modified Rankin (Stroke Patients Only)      Balance Overall balance assessment: Needs assistance Sitting-balance support: Bilateral upper extremity supported;Feet supported Sitting balance-Leahy Scale: Fair Sitting balance - Comments: Pt requiring close SBA when static sitting EOB; but responds to dynamic challenge with a posterior lean requiring CGA and vc's to correct.   Standing balance support: Bilateral upper extremity supported (on RW) Standing balance-Leahy Scale: Fair (with UE support)         Pertinent Vitals/Pain Pain Assessment: No/denies pain  HR and O2 monitored throughout session and maintained WFL.     Home Living Family/patient expects to be discharged to: Skilled nursing facility Living Arrangements: Alone Available Help at Discharge: Family;Available PRN/intermittently Type of Home: House Home Access: Stairs to enter Entrance Stairs-Rails: Right Entrance Stairs-Number of Steps: 2 Home Layout: Multi-level;Able to live on main level with bedroom/bathroom Home Equipment: Gilford Rile - 2 wheels;Cane - single point      Prior Function Level of Independence: Independent with assistive device(s)  Comments: Using cane for community ambulation up until ~1 wk ago, now uses RW for increased stability     Hand Dominance   Dominant Hand: Right    Extremity/Trunk Assessment   Upper Extremity Assessment: Defer to OT evaluation   Lower Extremity Assessment: RLE deficits/detail;LLE deficits/detail RLE  Deficits / Details: Hip  flexion 4/5; knee flex/ext and PF/DF 5/5 LLE Deficits / Details: Did not resist, but at least 3/5 throughout  Cervical / Trunk Assessment: Normal    Communication   Communication: No difficulties  Cognition Arousal/Alertness: Awake/alert Behavior During Therapy: WFL for tasks assessed/performed Overall Cognitive Status: Within Functional Limits for tasks assessed    General Comments General comments (skin integrity, edema, etc.): Ace bandaging to LLE, hemovac in place and draining.  Nursing cleared pt for participation in physical therapy.  Pt resting supine with family present bedside, agreeable to PT session.     Exercises Total Joint Exercises Ankle Circles/Pumps: AROM;Strengthening;Both;10 reps;Supine Quad Sets: AROM;Strengthening;Both;10 reps;Supine Gluteal Sets: AROM;Strengthening;Both;10 reps;Supine Short Arc Quad: AROM;AAROM;Strengthening;Both;10 reps;Supine (AROM RLE; AAROM LLE) Heel Slides: AROM;AAROM;Strengthening;Both;10 reps;Supine (AROM RLE; AAROM LLE) Hip ABduction/ADduction: AROM;AAROM;Strengthening;Both;10 reps;Supine (AROM RLE; AAROM LLE) Straight Leg Raises: AROM;Strengthening;Both;10 reps;Supine Goniometric ROM: L knee extension in supine lacking 6 degrees from neutral; L knee flexion seated in chair 73 degrees.      Assessment/Plan    PT Assessment Patient needs continued PT services  PT Diagnosis Difficulty walking;Generalized weakness   PT Problem List Decreased strength;Decreased activity tolerance;Decreased balance;Decreased mobility;Decreased knowledge of use of DME;Decreased safety awareness;Decreased knowledge of precautions  PT Treatment Interventions DME instruction;Gait training;Stair training;Functional mobility training;Therapeutic activities;Therapeutic exercise;Balance training;Patient/family education   PT Goals (Current goals can be found in the Care Plan section) Acute Rehab PT Goals Patient Stated Goal: To be able to do the things she was  doing before surgery PT Goal Formulation: With patient Time For Goal Achievement: 04/20/16 Potential to Achieve Goals: Good    Frequency BID   Barriers to discharge Decreased caregiver support (family is not available to provide 24/7 supervision/assist) Pt demonstrates impulsivity/decreased safety awareness requiring vc's to maintain safety and prevent falls.       End of Session Equipment Utilized During Treatment: Gait belt Activity Tolerance: Patient tolerated treatment well Patient left: in chair;with call bell/phone within reach;with chair alarm set;with family/visitor present;with SCD's reapplied (bilat heels elevated on towel rolls; polar care running) Nurse Communication: Mobility status;Precautions;Weight bearing status (via whiteboard)         [id number] PT Time Calculation (min) (ACUTE ONLY): 39 min   Charges:         PT G Codes:        Airanna Partin, SPT 04/06/2016, 9:38 AM

## 2016-04-06 NOTE — Care Management Note (Signed)
Case Management Note  Patient Details  Name: Katie Saunders MRN: [id number] Date of Birth: [birth year]  Subjective/Objective:                  Met with patient and her son (lives at [place]). They state that patient's daughter Katie Saunders is her Katie Saunders (lives in [facility] There is another friend/caregiver named Katie Saunders (lives in [place]). Patient/son state that patient lives alone usually and still drives. Per son Licensed conveyancer) patient has dementia and forgets which was noticed during my interview-she would repeat herself. She has a rolling walker available for use at home. They want SNF for rehab due to patient living alone. PT is currently recommending SNF. [facility].  Action/Plan: List of home health agencies left with patient. RNCM will continue to follow.   Expected Discharge Date:  04/08/16               Expected Discharge Plan:     In-House Referral:     Discharge planning Services  CM Consult  Post Acute Care Choice:  Home Health Choice offered to:  Patient, Adult Children  DME Arranged:    DME Agency:     HH Arranged:    [place] Agency:     Status of Service:  In process, will continue to follow  Medicare Important Message Given:    Date Medicare IM Given:    Medicare IM give by:    Date Additional Medicare IM Given:    Additional Medicare Important Message give by:     If discussed at [place] of Stay Meetings, dates discussed:    Additional Comments:  Katie Leeson, RN 04/06/2016, 11:43 AM

## 2016-04-06 NOTE — Clinical Social Work Note (Signed)
Clinical Social Work Assessment  Patient Details  Name: Katie Saunders MRN: [id number] Date of Birth: 1930-11-28  Date of referral:  04/06/16               Reason for consult:  Facility Placement                Permission sought to share information with:  Chartered certified accountant granted to share information::  Yes, Verbal Permission Granted  Name::      [place]::   [place]   Relationship::     Contact Information:     Housing/Transportation Living arrangements for the past 2 months:  [place] of Information:  Patient, Adult Children, Friend/Neighbor Patient Interpreter Needed:  None Criminal Activity/Legal Involvement Pertinent to Current Situation/Hospitalization:  No - Comment as needed Significant Relationships:  Adult Children, Friend Lives with:  Self Do you feel safe going back to the place where you live?  Yes Need for family participation in patient care:  Yes (Comment)  Care giving concerns:  Patient lives alone in [place].    Social Worker assessment / plan:  Holiday representative (CSW) received SNF consult. PT is recommending SNF. CSW met with patient and her son Luvenia Heller and friend Izora Gala were at bedside. CSW introduced self and explained role of CSW department. Patient was alert and oriented and was sitting up in the chair. Per patient she lives alone in [place] and her and her friend Izora Gala are retired Financial controller from the [employer]. Per patient her son Luvenia Heller is a retired Engineer, structural and lives in [place]. Per patient her daughter [name] lives in [place]. CSW explained that PT is recommending SNF today however patient may not qualify for SNF tomorrow if she is going to well. Patient reported she lives alone and prefers to go to [facility]. Patient is agreeable to SNF search. CSW explained that patient's insurance will not pay for SNF if she is doing to well.     CSW  presented bed offers to patient. She chose [facility]. CSW will continue to follow and assist as needed.   Employment status:  Retired Nurse, adult PT Recommendations:  [place] / Referral to community resources:  [place]  Patient/Family's Response to care:  Patient and son are agreeable for patient to go to [facility].   Patient/Family's Understanding of and Emotional Response to Diagnosis, Current Treatment, and Prognosis:  Patient was pleasant and thanked CSW for visit.   Emotional Assessment Appearance:  Appears stated age Attitude/Demeanor/Rapport:    Affect (typically observed):  Accepting, Adaptable, Pleasant Orientation:  Oriented to Self, Oriented to Place, Oriented to  Time, Oriented to Situation Alcohol / Substance use:  Not Applicable Psych involvement (Current and /or in the community):  No (Comment)  Discharge Needs  Concerns to be addressed:  Discharge Planning Concerns Readmission within the last 30 days:  No Current discharge risk:  Dependent with Mobility Barriers to Discharge:  Continued Medical Work up   Loralyn Freshwater, LCSW 04/06/2016, 2:09 PM

## 2016-04-06 NOTE — Plan of Care (Signed)
Pt continues to struggles with orientation and short term memory.  Pt has been aggressive at times and extremely argumentative with staff and family.  It does appear that pt is getting worse as the evening approaches and family has stated they will not be able to stay.  Dr. Marry Guan contacted to request PRN order for safety sitter as needed.

## 2016-04-06 NOTE — Clinical Social Work Placement (Signed)
   CLINICAL SOCIAL WORK PLACEMENT  NOTE  Date:  04/06/2016  Patient Details  Name: Katie Saunders MRN: [id number] Date of Birth: 11/22/1930  Clinical Social Work is seeking post-discharge placement for this patient at the [place] level of care (*CSW will initial, date and re-position this form in  chart as items are completed):  Yes   Patient/family provided with [place] Work Department's list of facilities offering this level of care within the geographic area requested by the patient (or if unable, by the patient's family).  Yes   Patient/family informed of their freedom to choose among providers that offer the needed level of care, that participate in Medicare, Medicaid or managed care program needed by the patient, have an available bed and are willing to accept the patient.  Yes   Patient/family informed of [facility]'s ownership interest in [facility] and [facility], as well as of the fact that they are under no obligation to receive care at these facilities.  PASRR submitted to EDS on 04/06/16     PASRR number received on 04/06/16     Existing PASRR number confirmed on       FL2 transmitted to all facilities in geographic area requested by pt/family on 04/06/16     FL2 transmitted to all facilities within larger geographic area on       Patient informed that his/her managed care company has contracts with or will negotiate with certain facilities, including the following:        Yes   Patient/family informed of bed offers received.  Patient chooses bed at  [facility] )     Physician recommends and patient chooses bed at      Patient to be transferred to   on  .  Patient to be transferred to facility by       Patient family notified on   of transfer.  Name of family member notified:        PHYSICIAN       Additional Comment:    _______________________________________________ Loralyn Freshwater,  LCSW 04/06/2016, 2:07 PM

## 2016-04-06 NOTE — Progress Notes (Signed)
Physical Therapy Treatment Patient Details Name: Katie Saunders MRN: [id number] DOB: [birth year] Today's Date: 04/06/2016    History of Present Illness Pt is an 82 yr old female s/p elective L TKA 04/05/16. PMH significant for HTN, gout, dementia, hyperlipidemia, CKD, and L breast CA.    PT Comments    Pt is progressing towards gait goal, able to ambulate 165ft with RW and CGA this session. Impulsivity, decreased safety awareness, and improper DME use despite constant cueing/education persist as barriers to d/c'ing home alone. Pt forgets to use RW, leaves it aside when turning/changing direction, or uses it with only one UE, posing a significant fall risk. In addition to gait and balance training, pt would benefit from continued PT for DME education, or trial of another assistive device which she could ambulate more safely with. Maintain recommendation for SNF pending progress with acute PT.   Follow Up Recommendations  SNF (pending progress)     Equipment Recommendations  Other (comment) (least restrictive assistive device )    Recommendations for Other Services       Precautions / Restrictions Precautions Precautions: Knee;Fall Restrictions Weight Bearing Restrictions: Yes LLE Weight Bearing: Weight bearing as tolerated    Mobility  Bed Mobility Overal bed mobility: Needs Assistance Bed Mobility: Supine to Sit;Sit to Supine  Supine to sit: Supervision;HOB elevated Sit to supine: Min guard;HOB elevated General bed mobility comments: CGA for LLE management sit > supine. Vc's for technique and safety. Close SBA for safety while sitting EOB.  Transfers Overall transfer level: Needs assistance Equipment used: Rolling walker (2 wheeled) Transfers: Sit to/from Stand Sit to Stand: Min guard  General transfer comment: Vc's for hand/foot placement and safe technique. Continues to stand in flexed posturing, initating hip/knee extension only when  standing.  Ambulation/Gait Ambulation/Gait assistance: Min guard Ambulation Distance (Feet): 100 Feet Assistive device: Rolling walker (2 wheeled) Gait Pattern/deviations: Step-through pattern;Decreased step length - right;Decreased step length - left;Decreased stance time - right Gait velocity: decreased   General Gait Details: Requires constant verbal and tacile cues for proper/safe use of DME. Noted to have several instances where she leaves the walker aside (presenting fall risk) or removes a hand from the RW while still in motion causing RW to drift to the side (again presenting a fall risk).  Distance limited by L knee pain, described as "moderate but tolerable".   Stairs      Wheelchair Mobility    Modified Rankin (Stroke Patients Only)       Balance Overall balance assessment: Needs assistance Sitting-balance support: Bilateral upper extremity supported;Feet supported Sitting balance-Leahy Scale: Fair Sitting balance - Comments: Pt requires close SBA when static sitting EOB 2/2 impulsivity and decreased safety awareness. Standing balance support: Bilateral upper extremity supported (on RW) Standing balance-Leahy Scale: Fair (with UE support on RW)     Cognition Arousal/Alertness: Awake/alert Behavior During Therapy: WFL for tasks assessed/performed Overall Cognitive Status: Within Functional Limits for tasks assessed  Decreased safety awareness; decreased awareness of need for assist    Exercises Total Joint Exercises Ankle Circles/Pumps: AROM;Strengthening;Both;10 reps;Supine Quad Sets: AROM;Strengthening;Both;10 reps;Supine Gluteal Sets: AROM;Strengthening;Both;10 reps;Supine Short Arc Quad: AROM;AAROM;Strengthening;Both;10 reps;Supine (AROM RLE; AAROM LLE) Heel Slides: AROM;AAROM;Strengthening;Both;10 reps;Supine (AROM RLE; AAROM LLE) Hip ABduction/ADduction: AROM;AAROM;Strengthening;Both;10 reps;Supine (AROM RLE; AAROM LLE) Straight Leg Raises:  AROM;Strengthening;Both;10 reps;Supine    General Comments General comments (skin integrity, edema, etc.): ACE bandaging to LLE, hemovac in place and draining. Polar care on and activated.       Pertinent Vitals/Pain Pain Assessment:  0-10 Pain Score: 5  (with activity, 1/10 at rest) Pain Location: L knee Pain Intervention(s): Limited activity within patient's tolerance;Monitored during session;Patient requesting pain meds-RN notified;Ice applied           PT Goals (current goals can now be found in the care plan section) Acute Rehab PT Goals Patient Stated Goal: To regain independence PT Goal Formulation: With patient Time For Goal Achievement: 04/20/16 Potential to Achieve Goals: Good Progress towards PT goals: Progressing toward goals    Frequency  BID    PT Plan Current plan remains appropriate       End of Session Equipment Utilized During Treatment: Gait belt Activity Tolerance: Patient limited by pain ("moderate, but tolerable") Patient left: in bed;with call bell/phone within reach;with bed alarm set;with family/visitor present;with SCD's reapplied (bilat heels elevated on towel rolls, polar care on and running)     Time: [id number] PT Time Calculation (min) (ACUTE ONLY): 28 min  Charges:  $Therapeutic Exercise: 8-22 mins                    G Codes:      Eleftherios Dudenhoeffer, SPT 04/06/2016, 3:21 PM

## 2016-04-06 NOTE — Progress Notes (Signed)
   04/06/16 1500  Clinical Encounter Type  Visited With Patient and family together  Visit Type Initial  Referral From Nurse  Consult/Referral To Chaplain  Spiritual Encounters  Spiritual Needs Literature  Stress Factors  Patient Stress Factors Exhausted;Health changes  Met w/patient & daughter to provide AD education. Provided materials after explaining purpose and process. Patient was advised to have the on-call chaplain paged when she is ready to proceed. Chap. Khila Papp G. [place]

## 2016-04-06 NOTE — Progress Notes (Signed)
   Subjective: 1 Day Post-Op Procedure(s) (LRB): COMPUTER ASSISTED TOTAL KNEE ARTHROPLASTY (Left) Patient reports pain as 1 on 0-10 scale.   Patient is well, and has had no acute complaints or problems We will start therapy today.  Plan is to go Rehab after hospital stay. no nausea and no vomiting Patient denies any chest pains or shortness of breath. Objective: Vital signs in last 24 hours: Temp:  [97.2 F (36.2 C)-98.7 F (37.1 C)] 98.1 F (36.7 C) (06/20 0400) Pulse Rate:  [61-84] 64 (06/20 0400) Resp:  [16-25] 18 (06/20 0400) BP: (119-160)/(54-80) 144/60 mmHg (06/20 0400) SpO2:  [96 %-100 %] 96 % (06/20 0400) Weight:  [72.122 kg (159 lb)] 72.122 kg (159 lb) (06/19 1011) Heels are non tender and elevated off the bed using rolled towels Intake/Output from previous day: 06/19 0701 - 06/20 0700 In: 2652.5 [P.O.:720; I.V.:1332.5; IV Piggyback:600] Out: 580 [Urine:435; Drains:95; Blood:50] Intake/Output this shift: Total I/O In: 740 [P.O.:240; IV Piggyback:500] Out: 405 [Urine:310; Drains:95]   Recent Labs  04/05/16 1001 04/06/16 0351  HGB 12.2 10.0*    Recent Labs  04/05/16 1001 04/06/16 0351  WBC  --  8.1  RBC  --  3.36*  HCT 36.0 29.1*  PLT  --  217    Recent Labs  04/05/16 1001 04/06/16 0351  NA 143 137  K 3.8 3.6  CL  --  109  CO2  --  23  BUN  --  26*  CREATININE  --  1.56*  GLUCOSE 105* 103*  CALCIUM  --  8.2*   No results for input(s): LABPT, INR in the last 72 hours.  EXAM General - Patient is Alert, Appropriate and Oriented Extremity - Neurologically intact Neurovascular intact Sensation intact distally Intact pulses distally Dorsiflexion/Plantar flexion intact Compartment soft Dressing - dressing C/D/I Motor Function - intact, moving foot and toes well on exam. Able to do SLR with minimal assistance  Past Medical History  Diagnosis Date  . Glaucoma   . Osteoporosis   . Hyperlipidemia   . Postmastectomy lymphedema   . Cataract    . Gout   . Hypertension   . Cancer [facility])     breast Left  . Depression   . Anxiety   . Dementia   . Renal insufficiency     Assessment/Plan: 1 Day Post-Op Procedure(s) (LRB): COMPUTER ASSISTED TOTAL KNEE ARTHROPLASTY (Left) Active Problems:   S/P total knee arthroplasty  Estimated body mass index is 27.28 kg/(m^2) as calculated from the following:   Height as of this encounter: 5\' 4"  (1.626 m).   Weight as of this encounter: 72.122 kg (159 lb). Advance diet Up with therapy D/C IV fluids Plan for discharge tomorrow Discharge to SNF  Labs: reviewed DVT Prophylaxis - Lovenox, Foot Pumps and TED hose; Lovenox adjusted by pharmacy  Weight-Bearing as tolerated to left leg D/C O2 and Pulse OX and try on Room Air Labs tomorrow am Has had BM already  [employer]. [place] 04/06/2016, 6:46 AM

## 2016-04-06 NOTE — Discharge Instructions (Signed)

## 2016-04-06 NOTE — Evaluation (Signed)
Occupational Therapy Evaluation Patient Details Name: Katie Saunders MRN: [id number] DOB: [birth year] Today's Date: 04/06/2016    History of Present Illness Pt is an 82 yr old female s/p elective L TKA 04/05/16. PMH significant for HTN, gout, dementia, hyperlipidemia, CKD, and L breast CA.   Clinical Impression   Pt. Is a 82 y.o. Female who was admitted for a Left TKR. Pt presents with limited ROM, Pain, weakness, and impaired functional mobility which hinder her ability to complete ADL and IADL tasks. Pt. could benefit from skilled OT services to review A/E use for LE ADLs, to review necessary home modifications, and to improve functional mobility for ADL/IADLs in order to work towards regaining Independence with ADL/IADLs.     Follow Up Recommendations  SNF    Equipment Recommendations       Recommendations for Other Services       Precautions / Restrictions Precautions Precautions: Knee;Fall Precaution Booklet Issued: Yes (comment) Restrictions Weight Bearing Restrictions: Yes LLE Weight Bearing: Weight bearing as tolerated               Balance Overall balance assessment: Needs assistance Sitting-balance support: Bilateral upper extremity supported;Feet supported Sitting balance-Leahy Scale: Fair                              ADL Overall ADL's : Needs assistance/impaired Eating/Feeding: Set up   Grooming: Set up           Upper Body Dressing : Minimal assistance   Lower Body Dressing: Minimal assistance;Moderate assistance               Functional mobility during ADLs: Min guard       Vision  Intact vision, wears glesses   Perception     Praxis      Pertinent Vitals/Pain Pain Assessment: 0-10 Pain Score: 2      Hand Dominance Right   Extremity/Trunk Assessment Upper Extremity Assessment Upper Extremity Assessment: Defer to OT evaluation     Communication Communication Communication: No difficulties   Cognition  Arousal/Alertness: Awake/alert Behavior During Therapy: WFL for tasks assessed/performed Overall Cognitive Status: Within Functional Limits for tasks assessed                     General Comments       Exercises     Shoulder Instructions      Home Living Family/patient expects to be discharged to:: Skilled nursing facility Living Arrangements: Alone Available Help at Discharge: Family;Friend(s) Type of Home: House Home Access: Stairs to enter [employer] of Steps: 2 Entrance Stairs-Rails: Right Home Layout: Multi-level;Able to live on main level with bedroom/bathroom Alternate Level Stairs-Number of Steps:  (Does not need to go upstairs)   Bathroom Shower/Tub: Walk-in shower;Door   [employer] Toilet: Standard     Home Equipment: Environmental consultant - 2 wheels;Cane - single point          Prior Functioning/Environment     OT Diagnosis: Generalized weakness   OT Problem List: Decreased strength;Pain;Decreased knowledge of use of DME or AE;Decreased activity tolerance   OT Treatment/Interventions: Self-care/ADL training;Therapeutic activities;Therapeutic exercise;DME and/or AE instruction;Patient/family education    OT Goals(Current goals can be found in the care plan section) Acute Rehab OT Goals Patient Stated Goal: To regain independence OT Goal Formulation: With patient Time For Goal Achievement: 04/06/16 Potential to Achieve Goals: Good  OT Frequency: Min 1X/week   Barriers to D/C:  Co-evaluation              End of Session Equipment Utilized During Treatment: Gait belt  Activity Tolerance: Patient tolerated treatment well Patient left: in chair;with call bell/phone within reach;with chair alarm set   Time: 0950-1015 OT Time Calculation (min): 25 min Charges:  OT General Charges $OT Visit: 1 Procedure OT Evaluation $OT Eval Moderate Complexity: 1 Procedure G-Codes:    Harrel Carina, MS, OTR/L Harrel Carina 04/06/2016,  10:50 AM

## 2016-04-07 LAB — BASIC METABOLIC PANEL
ANION GAP: 7 (ref 5–15)
BUN: 25 mg/dL — AB (ref 6–20)
CALCIUM: 8.4 mg/dL — AB (ref 8.9–10.3)
CO2: 23 mmol/L (ref 22–32)
Chloride: 109 mmol/L (ref 101–111)
Creatinine, Ser: 1.77 mg/dL — ABNORMAL HIGH (ref 0.44–1.00)
GFR calc Af Amer: 30 mL/min — ABNORMAL LOW (ref >60)
GFR, EST NON AFRICAN AMERICAN: 26 mL/min — AB (ref >60)
GLUCOSE: 98 mg/dL (ref 65–99)
Potassium: 3.2 mmol/L — ABNORMAL LOW (ref 3.5–5.1)
Sodium: 139 mmol/L (ref 135–145)

## 2016-04-07 LAB — CBC
HCT: 28.9 % — ABNORMAL LOW (ref 35.0–47.0)
Hemoglobin: 10 g/dL — ABNORMAL LOW (ref 12.0–16.0)
MCH: 29.9 pg (ref 26.0–34.0)
MCHC: 34.5 g/dL (ref 32.0–36.0)
MCV: 86.5 fL (ref 80.0–100.0)
PLATELETS: 190 10*3/uL (ref 150–440)
RBC: 3.34 MIL/uL — ABNORMAL LOW (ref 3.80–5.20)
RDW: 13.2 % (ref 11.5–14.5)
WBC: 7.3 10*3/uL (ref 3.6–11.0)

## 2016-04-07 NOTE — Clinical Documentation Improvement (Signed)
Orthopedic Please clarify the diagnosis of altered mental status/confusion.   Confusion/delirium (including drug induced)  Encephalopathy - Alcoholic, Anoxic/Hypoxia, Drug Induced/Toxic (specify drug), Hepatic, Hypertensive, Hypoglycemic, Metabolic/Septic, Traumatic/post concussive, Wernicke, Other   Dementia, type  And if associated with / without behavioral disturbances  Other  Clinically Undetermined  Document any associated diagnoses/conditions. Supporting Information: 04/06/16 note: "struggles with orientation and short term memory. Pt has been aggressive at times and extremely argumentative with staff and family. It does appear that pt is getting worse as the evening approaches" Safety sitter   04/07/16 note: "had some episodes of disorientation/confusion and egression on the night and subsequently had to be moved to a) to the nurse's desk. Patient seems to be doing very well is morning and with sleeping. Patient still confused however."   Please exercise your independent, professional judgment when responding. A specific answer is not anticipated or expected.   Thank You,  [facility] [phone]

## 2016-04-07 NOTE — Progress Notes (Addendum)
   Subjective: 2 Days Post-Op Procedure(s) (LRB): COMPUTER ASSISTED TOTAL KNEE ARTHROPLASTY (Left) Patient reports pain as 0 on 0-10 scale.   Patient is Is doing well but had some episodes of disorientation/confusion and egression on the night and subsequently had to be moved to a) to the nurse's desk. Patient seems to be doing very well is morning and with sleeping. Patient still confused however. Continue with physical therapy today.  Plan is to go Rehab after hospital stay. no nausea and no vomiting Patient denies any chest pains or shortness of breath. Objective: Vital signs in last 24 hours: Temp:  [97.6 F (36.4 C)-98.5 F (36.9 C)] 98.3 F (36.8 C) (06/21 0408) Pulse Rate:  [69-79] 78 (06/21 0408) Resp:  [17-18] 17 (06/21 0408) BP: (151-179)/(58-69) 175/69 mmHg (06/21 0408) SpO2:  [97 %-99 %] 97 % (06/21 0408) well approximated incision Heels are non tender and elevated off the bed using rolled towels Intake/Output from previous day: 06/20 0701 - 06/21 0700 In: 480 [P.O.:480] Out: 225 [Urine:200; Drains:25] Intake/Output this shift:     Recent Labs  04/05/16 1001 04/06/16 0351  HGB 12.2 10.0*    Recent Labs  04/05/16 1001 04/06/16 0351  WBC  --  8.1  RBC  --  3.36*  HCT 36.0 29.1*  PLT  --  217    Recent Labs  04/05/16 1001 04/06/16 0351  NA 143 137  K 3.8 3.6  CL  --  109  CO2  --  23  BUN  --  26*  CREATININE  --  1.56*  GLUCOSE 105* 103*  CALCIUM  --  8.2*   No results for input(s): LABPT, INR in the last 72 hours.  EXAM General - Patient is Alert, Confused and Patient thinks that she is home in bed. Extremity - Neurologically intact Neurovascular intact Sensation intact distally Intact pulses distally Dorsiflexion/Plantar flexion intact Compartment soft Dressing - dressing C/D/I Motor Function - intact, moving foot and toes well on exam.    Past Medical History  Diagnosis Date  . Glaucoma   . Osteoporosis   . Hyperlipidemia   .  Postmastectomy lymphedema   . Cataract   . Gout   . Hypertension   . Depression   . Anxiety   . Dementia   . Renal insufficiency   . Cancer [facility])     breast Left    Assessment/Plan: S/P Left total knee arthroplasty - Progressing with PT and OT  Secondary diagnoses: Dementia (exacerbated postoperatively) - minimizing use of narcotics Anxiety Depression Hypertension Hyperlipidemia Renal insufficiency Osteoporosis Gout   Estimated body mass index is 27.28 kg/(m^2) as calculated from the following:   Height as of this encounter: 5\' 4"  (1.626 m).   Weight as of this encounter: 72.122 kg (159 lb). Up with therapy Plan for discharge tomorrow Discharge to SNF  Labs: Pending for today DVT Prophylaxis - Lovenox, Foot Pumps and TED hose Weight-Bearing as tolerated to left leg We'll need to hold all narcotics and give Tylenol for pain. Needs to have a bowel movement.  Jillyn Ledger. [facility] PA [facility] 04/07/2016, 7:05 AM  Laurice Record. Holley Bouche M.D.

## 2016-04-07 NOTE — Progress Notes (Signed)
Patient calm and cooperative with care and nursing staff during my shift. Up to BR with supervision. Pt denies pain. Minimial reorientation given. Safety sitter left from bedside at 3am. No acute distress noted. Frequent rounding on patient. Patient resting between care.

## 2016-04-07 NOTE — Care Management (Signed)
CSW talked with patient daughter regarding the 'what if' insurance does not pay for SNF. She has chosen [facility]. List of PCS agencies left with patient's daughter.

## 2016-04-07 NOTE — Care Management Important Message (Signed)
Important Message  Patient Details  Name: Katie Saunders MRN: [id number] Date of Birth: [birth year]   Medicare Important Message Given:  Yes    Juliann Pulse A Mandy Peeks 04/07/2016, 10:28 AM

## 2016-04-07 NOTE — Progress Notes (Signed)
Physical Therapy Treatment Patient Details Name: Katie Saunders MRN: [id number] DOB: 01/26/1931 Today's Date: 04/07/2016    History of Present Illness Pt is an 82 yr old female s/p elective L TKA 04/05/16. PMH significant for HTN, gout, dementia, hyperlipidemia, CKD, and L breast CA.    PT Comments    PT agreeable to PT. Pt notes pain in left knee is "in the middle", 5/10, and tolerable. Pt demonstrating excellent range of motion left knee. Education on stretching both in flexion and extension and importance of maintaining/gaining range with consistent stretching; family educated simultaneously. Pt demonstrates safe transfer techniques with cues on using Left lower extremity equally to the right. Ambulation improving with cues provided for more fluid use of Bilateral lower extremities and reciprocal pattern with equal step lengths; improving inconsistently. Pt fatigues with ambulation, but no loss of balance. Pt does not demonstrate any impulsive behaviors today. Pt up in chair comfortably. Plan to see pt this afternoon for continued work on strengthening, endurance and quality of ambulation to improve functional mobility.   Follow Up Recommendations  SNF     Equipment Recommendations       Recommendations for Other Services       Precautions / Restrictions Precautions Precautions: Knee;Fall Restrictions Weight Bearing Restrictions: Yes LLE Weight Bearing: Weight bearing as tolerated    Mobility  Bed Mobility               General bed mobility comments: Not tested; up on commode  Transfers Overall transfer level: Needs assistance Equipment used: Rolling walker (2 wheeled) Transfers: Sit to/from Stand Sit to Stand: Min guard         General transfer comment: Instruction on use of BLEs equally; good use of hands  Ambulation/Gait Ambulation/Gait assistance: Min guard Ambulation Distance (Feet): 40 Feet (20 ft x 1) Assistive device: Rolling walker (2 wheeled) Gait  Pattern/deviations: Step-to pattern;Step-through pattern;Decreased step length - right;Decreased step length - left;Decreased stride length;Decreased stance time - left;Decreased dorsiflexion - right;Decreased dorsiflexion - left (stiff legged) Gait velocity: decreased Gait velocity interpretation: <1.8 ft/sec, indicative of risk for recurrent falls General Gait Details: Initially LLE very stiff; short stiff steps; encouraged increased knee flexion bilaterally and equal steps with a reciprocal pattern; Improved inconsistently to partial step through. Fatigues with ambulation   Stairs            Wheelchair Mobility    Modified Rankin (Stroke Patients Only)       Balance Overall balance assessment: Needs assistance Sitting-balance support: Bilateral upper extremity supported;Feet supported Sitting balance-Leahy Scale: Good     Standing balance support: Bilateral upper extremity supported Standing balance-Leahy Scale: Fair                      Cognition Arousal/Alertness: Awake/alert Behavior During Therapy: WFL for tasks assessed/performed Overall Cognitive Status: Within Functional Limits for tasks assessed                      Exercises Total Joint Exercises Ankle Circles/Pumps: AROM;Both;20 reps (long sit) Quad Sets: Strengthening;Both;20 reps (long sit; ankle roll under L) Knee Flexion: AROM;Left;10 reps;Seated (3 positions each rep with 10 sec hold each) Goniometric ROM: 0 to 102 degrees    General Comments General comments (skin integrity, edema, etc.): bandage and drain removed. Mild blood tinged at drain site and bruise on medial L knee approximately 3 inches by 1 inch      Pertinent Vitals/Pain Pain Assessment: 0-10 Pain Score:  5  Pain Location: L knee Pain Descriptors / Indicators: Aching;Constant;Sore Pain Intervention(s): Limited activity within patient's tolerance;Monitored during session;Ice applied    Home Living                       Prior Function            PT Goals (current goals can now be found in the care plan section) Progress towards PT goals: Progressing toward goals    Frequency  BID    PT Plan Current plan remains appropriate    Co-evaluation             End of Session Equipment Utilized During Treatment: Gait belt Activity Tolerance: Patient tolerated treatment well;Patient limited by fatigue Patient left: in chair;with call bell/phone within reach;with chair alarm set;with family/visitor present;with SCD's reapplied;Other (comment) (polar care in place)     Time: [id number] PT Time Calculation (min) (ACUTE ONLY): 28 min  Charges:  $Gait Training: 8-22 mins $Therapeutic Exercise: 8-22 mins                    G Codes:      Charlaine Dalton, PTA 04/07/2016, 12:04 PM

## 2016-04-07 NOTE — Progress Notes (Signed)
Physical Therapy Treatment Patient Details Name: Katie Saunders MRN: [id number] DOB: 1931-08-13 Today's Date: 04/07/2016    History of Present Illness Pt is an 82 yr old female s/p elective L TKA 04/05/16. PMH significant for HTN, gout, dementia, hyperlipidemia, CKD, and L breast CA.    PT Comments    Pt still sleeping; family agreeable to wake pt for PT. Pt requires assist for bed mobility and has one loss of balance with first stand. Improved second stand with cues to weight shift several times in preparation for ambulation. Left knee range continues very well this afternoon; however, pt ambulates with very stiff legged gait with small partial step through pattern despite cues. Pt received up in chair and re educated on seated flexion stretch and long sit quad set with extension stretch, as pt does not remember from morning session. Continue PT to progress strength, endurance, transfers and quality/distance of ambulation for improved functional mobility.   Follow Up Recommendations  SNF     Equipment Recommendations       Recommendations for Other Services       Precautions / Restrictions Precautions Precautions: Knee;Fall Restrictions Weight Bearing Restrictions: Yes LLE Weight Bearing: Weight bearing as tolerated    Mobility  Bed Mobility Overal bed mobility: Needs Assistance Bed Mobility: Supine to Sit     Supine to sit: Min assist     General bed mobility comments: for LEs  Transfers Overall transfer level: Needs assistance Equipment used: Rolling walker (2 wheeled) Transfers: Sit to/from Stand Sit to Stand: Min guard         General transfer comment: loses balance with first stand posteriorly and assisted to bed. Improved steadiness second stand with instruction for side to side weigth shift before attempting to walk  Ambulation/Gait Ambulation/Gait assistance: Min guard Ambulation Distance (Feet): 25 Feet (2x) Assistive device: Rolling walker (2  wheeled) Gait Pattern/deviations: Step-to pattern;Step-through pattern;Decreased stride length (stiff leg pattern) Gait velocity: decreased Gait velocity interpretation: <1.8 ft/sec, indicative of risk for recurrent falls General Gait Details: Stiff legged gait this afternoon with partial step through; despite demonstrating good flexion with stretch. Possibly feeling less steady, as pt has just awoken for several hour nap.    Stairs            Wheelchair Mobility    Modified Rankin (Stroke Patients Only)       Balance   Sitting-balance support: Bilateral upper extremity supported Sitting balance-Leahy Scale: Good     Standing balance support: Bilateral upper extremity supported Standing balance-Leahy Scale: Fair                      Cognition Arousal/Alertness: Awake/alert (initially still sleeping;family agreeable to awaken) Behavior During Therapy: WFL for tasks assessed/performed Overall Cognitive Status: Within Functional Limits for tasks assessed                      Exercises Total Joint Exercises Ankle Circles/Pumps:  (long sit) Quad Sets: Strengthening;Both;20 reps (long sit; ankle roll under L) Long Arc Quad: AROM;Left;20 reps;Seated Knee Flexion: AROM;Left;10 reps;Seated (3 positions each rep with 10 sec hold each)    General Comments        Pertinent Vitals/Pain Pain Assessment: 0-10 Pain Location: L knee Pain Intervention(s): Limited activity within patient's tolerance;Monitored during session;Ice applied    Home Living                      Prior Function  PT Goals (current goals can now be found in the care plan section) Progress towards PT goals: Progressing toward goals    Frequency  BID    PT Plan Current plan remains appropriate    Co-evaluation             End of Session Equipment Utilized During Treatment: Gait belt Activity Tolerance: Patient tolerated treatment well;Patient limited by  fatigue Patient left: in chair;with call bell/phone within reach;with chair alarm set;with family/visitor present;with SCD's reapplied;Other (comment) (polar care in place)     Time: [id number] PT Time Calculation (min) (ACUTE ONLY): 24 min  Charges:                       G CodesCharlaine Dalton, PTA 04/07/2016, 4:42 PM

## 2016-04-07 NOTE — Progress Notes (Signed)
   04/07/16 1000  Clinical Encounter Type  Visited With Patient and family together  Visit Type Follow-up  Consult/Referral To Chaplain   Patient completed AD and we have made copies and handed a copy over to nursing secretary to scan and put in file.   [place] [phone]

## 2016-04-07 NOTE — Progress Notes (Signed)
PT Cancellation Note  Patient Details Name: Katie Saunders MRN: [id number] DOB: [birth year]   Cancelled Treatment:    Reason Eval/Treat Not Completed: Fatigue/lethargy limiting ability to participate;Other (comment). Treatment attempted this afternoon. Pt soundly sleeping; family in the room. Family requests pt not be disturbed at this time and prefer PT to check back later if possible. Will attempt to check back later this afternoon as the schedule allows.    Charlaine Dalton, [place] 04/07/2016, 1:51 PM

## 2016-04-07 NOTE — Progress Notes (Signed)
Clinical Social Worker (CSW) met with patient's daughter [name] outside of patient's room. Daughter is in agreement with plan for patient to D/C to [facility]. CSW made [facility] admissions coordinator at [facility] aware patient will likely D/C tomorrow pending medical clearance.   Blima Rich, LCSW [phone]

## 2016-04-08 ENCOUNTER — Encounter
Admission: RE | Admit: 2016-04-08 | Discharge: 2016-04-08 | Disposition: A | Discharge status: Home / Self Care | Payer: Medicare Other | Source: Ambulatory Visit | Attending: Internal Medicine | Admitting: Internal Medicine

## 2016-04-08 DIAGNOSIS — E871 Hypo-osmolality and hyponatremia: Secondary | ICD-10-CM | POA: Insufficient documentation

## 2016-04-08 DIAGNOSIS — D649 Anemia, unspecified: Secondary | ICD-10-CM | POA: Insufficient documentation

## 2016-04-08 NOTE — Discharge Planning (Addendum)
Report called to [facility], s/w Francene Finders, LPN.  IV removed.  Staff informed of suggested FU appts. And also sent scripts in packet.  Pt went to rm 214.  RN assessment and VS revealed stability for DC to facility.  EMS transported and family notified of transport.

## 2016-04-08 NOTE — Discharge Summary (Signed)
Physician Discharge Summary  Patient ID: Katie Saunders MRN: [id number] DOB/AGE: 1931-08-10 82 y.o.  Admit date: 04/05/2016 Discharge date: 04/08/2016  Admission Diagnoses:  primary osteoarthritis   Discharge Diagnoses: Patient Active Problem List   Diagnosis Date Noted  . S/P total knee arthroplasty 04/05/2016  . Essential hypertension 12/01/2015  . Gout 09/22/2015  . Dementia 09/22/2015  . Glaucoma 05/26/2015  . Hyperlipidemia   . CKD stage 4 secondary to hypertension [facility])     Past Medical History  Diagnosis Date  . Glaucoma   . Osteoporosis   . Hyperlipidemia   . Postmastectomy lymphedema   . Cataract   . Gout   . Hypertension   . Depression   . Anxiety   . Dementia   . Renal insufficiency   . Cancer [facility])     breast Left     Transfusion: no transfusion given during this admission   Consultants (if any):  Case management for assistance with placement  Discharged Condition: Improved  Hospital Course: Katie Saunders is an 82 y.o. female who was admitted 04/05/2016 with a diagnosis of degenerative arthrosis of left knee and went to the operating room on 04/05/2016 and underwent the above named procedures.    Surgeries:Procedure(s): COMPUTER ASSISTED TOTAL KNEE ARTHROPLASTY on 04/05/2016  PRE-OPERATIVE DIAGNOSIS: Degenerative arthrosis of the left knee, primary  POST-OPERATIVE DIAGNOSIS: Same  PROCEDURE: Left total knee arthroplasty using computer-assisted navigation  SURGEON: Marciano Sequin. M.D.  ASSISTANT: Vance Peper, PA (present and scrubbed throughout the case, critical for assistance with exposure, retraction, instrumentation, and closure)  ANESTHESIA: spinal  ESTIMATED BLOOD LOSS: 50 mL  FLUIDS REPLACED: 1300 mL of crystalloid  TOURNIQUET TIME: 81 minutes  DRAINS: 2 medium drains to a reinfusion system  SOFT TISSUE RELEASES: Anterior cruciate ligament, posterior cruciate ligament, deep medial collateral ligament, patellofemoral  ligament   IMPLANTS UTILIZED: DePuy Attune size 5 posterior stabilized femoral component (cemented), size 5 rotating platform tibial component (cemented), 35 mm medialized dome patella (cemented), and a 6 mm stabilized rotating platform polyethylene insert.  INDICATIONS FOR SURGERY: Katie Saunders is a 82 y.o. year old female with a long history of progressive knee pain. X-rays demonstrated severe degenerative changes in tricompartmental fashion. The patient had not seen any significant improvement despite conservative nonsurgical intervention. After discussion of the risks and benefits of surgical intervention, the patient expressed understanding of the risks benefits and agree with plans for total knee arthroplasty.   The risks, benefits, and alternatives were discussed at length including but not limited to the risks of infection, bleeding, nerve injury, stiffness, blood clots, the need for revision surgery, cardiopulmonary complications, among others, and they were willing to proceed. Patient tolerated the surgery well. No complications .Patient was taken to PACU where she was stabilized and then transferred to the orthopedic floor.  Patient started on Lovenox 30 q 24 hrs per pharmacy do to [place]. Foot pumps applied bilaterally at 80 mm hgb Heels elevated off bed with rolled towels. No evidence of DVT. Calves non tender. Negative [place]. Physical therapy started on day #1 for gait training and transfer with OT starting on  day #1 for ADL and assisted devices. Patient has done well with therapy. Ambulated 100 feet upon being discharged.  Patient's IV and foley were discontinued on day # 1 with the hemovac being discontinued on day #2. Dressing was changed on day #3.   She was given perioperative antibiotics:  Anti-infectives    Start     Dose/Rate  Route Frequency Ordered Stop   04/05/16 1730  ceFAZolin (ANCEF) IVPB 2g/100 mL premix     2 g 200 mL/hr over 30 Minutes Intravenous Every 6 hours  04/05/16 1538 04/06/16 1130   04/05/16 0912  ceFAZolin (ANCEF) 2-4 GM/100ML-% IVPB    Comments:  Katie Saunders: cabinet override      04/05/16 0912 04/05/16 2114   04/05/16 0323  ceFAZolin (ANCEF) IVPB 2g/100 mL premix     2 g 200 mL/hr over 30 Minutes Intravenous On call to O.R. 04/05/16 [id number] 04/05/16 1137    .  She was fitted with AV 1 compression foot pump devices,instructed on heel pumps, early ambulation, and fitted with TED stocking bilaterally  for DVT prophylaxis.  She benefited maximally from the hospital stay and there were no complications.    Recent vital signs:  Filed Vitals:   04/07/16 2029 04/08/16 0603  BP: 178/67 145/60  Pulse: 81 76  Temp: 98.4 F (36.9 C) 98 F (36.7 C)  Resp: 18 19    Recent laboratory studies:  Lab Results  Component Value Date   HGB 10.0* 04/07/2016   HGB 10.0* 04/06/2016   HGB 12.2 04/05/2016   Lab Results  Component Value Date   WBC 7.3 04/07/2016   PLT 190 04/07/2016   Lab Results  Component Value Date   INR 1.00 03/24/2016   Lab Results  Component Value Date   NA 139 04/07/2016   K 3.2* 04/07/2016   CL 109 04/07/2016   CO2 23 04/07/2016   BUN 25* 04/07/2016   CREATININE 1.77* 04/07/2016   GLUCOSE 98 04/07/2016    Discharge Medications:     Medication List    TAKE these medications        COMBIGAN 0.2-0.5 % ophthalmic solution  Generic drug:  brimonidine-timolol  Place 1 drop into both eyes every 12 (twelve) hours.     donepezil 5 MG tablet  Commonly known as:  ARICEPT  Take 1 tablet (5 mg total) by mouth at bedtime.     dorzolamide 2 % ophthalmic solution  Commonly known as:  TRUSOPT  Place 1 drop into the left eye 2 (two) times daily.     enoxaparin 30 MG/0.3ML injection  Commonly known as:  LOVENOX  Inject 0.3 mLs (30 mg total) into the skin daily.     febuxostat 40 MG tablet  Commonly known as:  ULORIC  Take 1 tablet (40 mg total) by mouth daily.     hydrochlorothiazide 25 MG tablet  Commonly  known as:  HYDRODIURIL  Take 1 tablet (25 mg total) by mouth daily.     losartan 50 MG tablet  Commonly known as:  COZAAR  Take 1 tablet (50 mg total) by mouth daily.     LUMIGAN 0.01 % Soln  Generic drug:  bimatoprost  Place 1 drop into both eyes at bedtime.     metoprolol 50 MG tablet  Commonly known as:  LOPRESSOR  Take 1 tablet (50 mg total) by mouth 2 (two) times daily.     oxyCODONE 5 MG immediate release tablet  Commonly known as:  Oxy IR/ROXICODONE  Take 1-2 tablets (5-10 mg total) by mouth every 4 (four) hours as needed for severe pain or breakthrough pain.     sodium chloride 5 % ophthalmic solution  Commonly known as:  MURO 128  Place 1 drop into both eyes as needed for irritation.     traMADol 50 MG tablet  Commonly known as:  Veatrice Bourbon  Take 1-2 tablets (50-100 mg total) by mouth every 4 (four) hours as needed for moderate pain.     VAYACOG 100-19.5-6.5 MG Caps  Generic drug:  Phosphatidylserine-DHA-EPA  TAKE 1 CAPSULE EVERY DAY        Diagnostic Studies: Dg Knee Left Port  04/05/2016  CLINICAL DATA:  Followup total knee replacement EXAM: PORTABLE LEFT KNEE - 1-2 VIEW COMPARISON:  None. FINDINGS: Prosthetic components appear well positioned. No radiographically detectable complication. Suprapatellar drain in place. IMPRESSION: Good appearance following total knee replacement. Electronically Signed   By: Nelson Chimes M.D.   On: 04/05/2016 15:05    Disposition: Final discharge disposition not confirmed      Discharge Instructions    Diet - low sodium heart healthy    Complete by:  As directed      Diet - low sodium heart healthy    Complete by:  As directed      Discharge instructions    Complete by:  As directed   TED stocking to be worn at all times except may be removed one hour per 8 hr shift WBAT [place] care to be on 24 hours a day     Increase activity slowly    Complete by:  As directed      Increase activity slowly    Complete by:  As directed             Follow-up Information    Follow up with Feliberto Gottron, PA-C On 04/19/2016.   Specialties:  Orthopedic Surgery, Emergency Medicine   Why:  at 9:45am   Contact information:   [place] [address] [phone]       Follow up with Dereck Leep, MD On 05/18/2016.   Specialty:  Orthopedic Surgery   Why:  at 11:15am   Contact information:   [address] [facility] [place] [address] [phone]       Follow up with [facility] .   Specialty:  [place] information:   [address] [place] [id number]       Signed: Watt Climes 04/08/2016, 7:38 AM

## 2016-04-08 NOTE — Clinical Social Work Placement (Signed)
   CLINICAL SOCIAL WORK PLACEMENT  NOTE  Date:  04/08/2016  Patient Details  Name: Katie Saunders MRN: [id number] Date of Birth: 1931-08-26  Clinical Social Work is seeking post-discharge placement for this patient at the [place] level of care (*CSW will initial, date and re-position this form in  chart as items are completed):  Yes   Patient/family provided with [facility] Work Department's list of facilities offering this level of care within the geographic area requested by the patient (or if unable, by the patient's family).  Yes   Patient/family informed of their freedom to choose among providers that offer the needed level of care, that participate in Medicare, Medicaid or managed care program needed by the patient, have an available bed and are willing to accept the patient.  Yes   Patient/family informed of [facility]'s ownership interest in [facility] and [facility], as well as of the fact that they are under no obligation to receive care at these facilities.  PASRR submitted to EDS on 04/06/16     PASRR number received on 04/06/16     Existing PASRR number confirmed on       FL2 transmitted to all facilities in geographic area requested by pt/family on 04/06/16     FL2 transmitted to all facilities within larger geographic area on       Patient informed that his/her managed care company has contracts with or will negotiate with certain facilities, including the following:        Yes   Patient/family informed of bed offers received.  Patient chooses bed at  [facility] )     Physician recommends and patient chooses bed at      Patient to be transferred to  [facility] ) on 04/08/16.  Patient to be transferred to facility by  [facility] )     Patient family notified on 04/08/16 of transfer.  Name of family member notified:   (CSW left patient's daughter [name] and son [name]. )     PHYSICIAN      Additional Comment:    _______________________________________________ Loralyn Freshwater, LCSW 04/08/2016, 10:38 AM

## 2016-04-08 NOTE — Progress Notes (Signed)
Checked to see if pre-authorization would be needed for non-emergent EMS transport. Per [employer] benefits obtained online through [employer], patient has a [employer] Medicare Advantage PPO policy.  Medicare PPO plans do not require pre-auth for non-emergent ground transports using service codes A0426 or A0428.   

## 2016-04-08 NOTE — Progress Notes (Signed)
Patient is medically stable for D/C to [facility] today. Per Kim admissions coordinator at [facility] patient will go to room 214. RN will call report at [phone] and arrange EMS after 11:30 per facility request. Clinical Social Worker (CSW) sent D/C Summary, FL2 and D/C Packet to [employer] via [employer]. Patient is aware of above. CSW contacted patient's daughter [name] and son Luvenia Heller however they did not answer and a voicemail was left. Please reconsult if future social work needs arise. CSW signing off.   Blima Rich, LCSW [phone]

## 2016-04-08 NOTE — Progress Notes (Signed)
Physical Therapy Treatment Patient Details Name: Katie Saunders MRN: [id number] DOB: [birth year] Today's Date: 04/08/2016    History of Present Illness Pt is an 82 yr old female s/p elective L TKA 04/05/16. PMH significant for HTN, gout, dementia, hyperlipidemia, CKD, and L breast CA.    PT Comments    Pt noting a little increased pain and stiffness left knee today. Range continues in good ranges. Initiated stand exercises on left. Gait continues limited in quality and distance. Pt to go to rehab today to continue progress on strength, endurance, and all functional mobility.   Follow Up Recommendations  SNF     Equipment Recommendations       Recommendations for Other Services       Precautions / Restrictions Precautions Precautions: Knee;Fall Restrictions Weight Bearing Restrictions: Yes LLE Weight Bearing: Weight bearing as tolerated    Mobility  Bed Mobility Overal bed mobility: Modified Independent (requests assist, but able to with use of rail and time) Bed Mobility: Supine to Sit     Supine to sit: Modified independent (Device/Increase time) Sit to supine: Modified independent (Device/Increase time)   General bed mobility comments: Encouraged to perform with Mod I  Transfers Overall transfer level: Needs assistance Equipment used: Rolling walker (2 wheeled) Transfers: Sit to/from Stand Sit to Stand: Supervision         General transfer comment: close supervision and cues for equal use of LEs  Ambulation/Gait Ambulation/Gait assistance: Min guard Ambulation Distance (Feet): 40 Feet Assistive device: Rolling walker (2 wheeled) Gait Pattern/deviations: Step-through pattern;Decreased stride length;Decreased stance time - left;Decreased step length - right;Antalgic (Partial step through) Gait velocity: decreased Gait velocity interpretation: <1.8 ft/sec, indicative of risk for recurrent falls General Gait Details: Contines with stiff L leg pattern; notes  feeling stiff from lying in bed. Encouraged bending, exercises or change in position more often. Also notes mild increase in pain with ambulation today.    Stairs            Wheelchair Mobility    Modified Rankin (Stroke Patients Only)       Balance   Sitting-balance support: Feet supported Sitting balance-Leahy Scale: Good     Standing balance support: Bilateral upper extremity supported Standing balance-Leahy Scale: Fair                      Cognition Arousal/Alertness: Awake/alert Behavior During Therapy: WFL for tasks assessed/performed Overall Cognitive Status: Within Functional Limits for tasks assessed                      Exercises Total Joint Exercises Hip ABduction/ADduction: AROM;Strengthening;Left;20 reps;Standing Straight Leg Raises: AROM;Strengthening;Left;20 reps;Standing Knee Flexion: AROM;Left;10 reps;Seated (3 position each rep with 10 sec hold each) Goniometric ROM: 0-102 Marching in Standing: AROM;Left;20 reps;Standing;Strengthening Standing Hip Extension: AROM;Strengthening;Left;20 reps;Standing    General Comments        Pertinent Vitals/Pain Pain Assessment: 0-10 Pain Score: 3  Pain Location: L knee Pain Descriptors / Indicators: Aching;Sore Pain Intervention(s): Monitored during session;Ice applied    Home Living                      Prior Function            PT Goals (current goals can now be found in the care plan section) Progress towards PT goals: Progressing toward goals    Frequency  BID    PT Plan Current plan remains appropriate    Co-evaluation  End of Session Equipment Utilized During Treatment: Gait belt Activity Tolerance: Patient tolerated treatment well Patient left: Other (comment) (edge of bed with aide for personal hygiene)     Time: [id number] PT Time Calculation (min) (ACUTE ONLY): 31 min  Charges:  $Gait Training: 8-22 mins $Therapeutic Exercise: 8-22  mins                    G Codes:      Charlaine Dalton, PTA 04/08/2016, 10:26 AM

## 2016-04-08 NOTE — Progress Notes (Signed)
   Subjective: 3 Days Post-Op Procedure(s) (LRB): COMPUTER ASSISTED TOTAL KNEE ARTHROPLASTY (Left) Patient reports pain as 0 on 0-10 scale.   Patient is well, and has had no acute complaints or problems; pt appears to be back to her normal baseline now. Does not appear to be confused today Continue with physical therapy today.  Plan is to go Rehab after hospital stay. no nausea and no vomiting Patient denies any chest pains or shortness of breath. Objective: Vital signs in last 24 hours: Temp:  [97.7 F (36.5 C)-98.8 F (37.1 C)] 98 F (36.7 C) (06/22 0603) Pulse Rate:  [72-81] 76 (06/22 0603) Resp:  [18-19] 19 (06/22 0603) BP: (142-178)/(60-68) 145/60 mmHg (06/22 0603) SpO2:  [98 %] 98 % (06/22 0603) well approximated incision Heels are non tender and elevated off the bed using rolled towels Intake/Output from previous day: 06/21 0701 - 06/22 0700 In: 360 [P.O.:360] Out: -  Intake/Output this shift:     Recent Labs  04/05/16 1001 04/06/16 0351 04/07/16 0634  HGB 12.2 10.0* 10.0*    Recent Labs  04/06/16 0351 04/07/16 0634  WBC 8.1 7.3  RBC 3.36* 3.34*  HCT 29.1* 28.9*  PLT 217 190    Recent Labs  04/06/16 0351 04/07/16 0634  NA 137 139  K 3.6 3.2*  CL 109 109  CO2 23 23  BUN 26* 25*  CREATININE 1.56* 1.77*  GLUCOSE 103* 98  CALCIUM 8.2* 8.4*   No results for input(s): LABPT, INR in the last 72 hours.  EXAM General - Patient is Alert, Appropriate and Oriented Extremity - Neurologically intact Neurovascular intact Sensation intact distally Intact pulses distally Dorsiflexion/Plantar flexion intact Compartment soft Dressing - dressing C/D/I Motor Function - intact, moving foot and toes well on exam.    Past Medical History  Diagnosis Date  . Glaucoma   . Osteoporosis   . Hyperlipidemia   . Postmastectomy lymphedema   . Cataract   . Gout   . Hypertension   . Depression   . Anxiety   . Dementia   . Renal insufficiency   . Cancer ([place])      breast Left    Assessment/Plan: 3 Days Post-Op Procedure(s) (LRB): COMPUTER ASSISTED TOTAL KNEE ARTHROPLASTY (Left) Active Problems:   S/P total knee arthroplasty  Estimated body mass index is 27.28 kg/(m^2) as calculated from the following:   Height as of this encounter: 5\' 4"  (1.626 m).   Weight as of this encounter: 72.122 kg (159 lb). Discharge to SNF  Labs: none DVT Prophylaxis - Lovenox, Foot Pumps and TED hose Weight-Bearing as tolerated to right leg Please change dressing prior to d/c today.  Jillyn Ledger. Macdoel Bigelow 04/08/2016, 7:30 AM

## 2016-04-13 DIAGNOSIS — E871 Hypo-osmolality and hyponatremia: Secondary | ICD-10-CM | POA: Diagnosis not present

## 2016-04-13 DIAGNOSIS — D649 Anemia, unspecified: Secondary | ICD-10-CM | POA: Diagnosis not present

## 2016-04-13 LAB — CBC WITH DIFFERENTIAL/PLATELET
BASOS PCT: 1 %
Basophils Absolute: 0.1 10*3/uL (ref 0–0.1)
EOS ABS: 0.3 10*3/uL (ref 0–0.7)
Eosinophils Relative: 5 %
HEMATOCRIT: 31.3 % — AB (ref 35.0–47.0)
HEMOGLOBIN: 10.7 g/dL — AB (ref 12.0–16.0)
LYMPHS ABS: 1.4 10*3/uL (ref 1.0–3.6)
Lymphocytes Relative: 20 %
MCH: 29.4 pg (ref 26.0–34.0)
MCHC: 34.2 g/dL (ref 32.0–36.0)
MCV: 86.2 fL (ref 80.0–100.0)
MONOS PCT: 7 %
Monocytes Absolute: 0.5 10*3/uL (ref 0.2–0.9)
NEUTROS ABS: 4.7 10*3/uL (ref 1.4–6.5)
NEUTROS PCT: 67 %
Platelets: 336 10*3/uL (ref 150–440)
RBC: 3.63 MIL/uL — AB (ref 3.80–5.20)
RDW: 13.5 % (ref 11.5–14.5)
WBC: 7 10*3/uL (ref 3.6–11.0)

## 2016-04-13 LAB — COMPREHENSIVE METABOLIC PANEL
ALBUMIN: 3.2 g/dL — AB (ref 3.5–5.0)
ALK PHOS: 89 U/L (ref 38–126)
ALT: 15 U/L (ref 14–54)
ANION GAP: 9 (ref 5–15)
AST: 25 U/L (ref 15–41)
BILIRUBIN TOTAL: 0.3 mg/dL (ref 0.3–1.2)
BUN: 23 mg/dL — AB (ref 6–20)
CALCIUM: 9.1 mg/dL (ref 8.9–10.3)
CO2: 27 mmol/L (ref 22–32)
CREATININE: 1.49 mg/dL — AB (ref 0.44–1.00)
Chloride: 101 mmol/L (ref 101–111)
GFR calc Af Amer: 37 mL/min — ABNORMAL LOW (ref >60)
GFR calc non Af Amer: 32 mL/min — ABNORMAL LOW (ref >60)
GLUCOSE: 143 mg/dL — AB (ref 65–99)
Potassium: 3.7 mmol/L (ref 3.5–5.1)
SODIUM: 137 mmol/L (ref 135–145)
TOTAL PROTEIN: 6.4 g/dL — AB (ref 6.5–8.1)

## 2016-04-15 ENCOUNTER — Non-Acute Institutional Stay (SKILLED_NURSING_FACILITY): Payer: Medicare Other | Admitting: Gerontology

## 2016-04-15 DIAGNOSIS — M10372 Gout due to renal impairment, left ankle and foot: Secondary | ICD-10-CM

## 2016-04-15 NOTE — Progress Notes (Signed)
Location:  [facility] Service:  SNF [phone]) Provider:  Toni Arthurs, NP-C  Golden Pop, MD  Patient Care Team: Guadalupe Maple, MD as PCP - General (Family Medicine) Lollie Sails, MD as Consulting Physician (Gastroenterology) Robert Bellow, MD (General Surgery)  Extended Emergency Contact Information Primary Emergency Contact: [facility] Address: [place],  [address] [address]itter of [place] Phone: [phone] Relation: Son Secondary Emergency Contact: Gaye [place] [employer] Phone: [phone] Mobile Phone: [phone] Relation: Daughter  Code Status:  Full Code Goals of care: Advanced Directive information Advanced Directives 04/07/2016  Does patient have an advance directive? Yes  Type of Advance Directive Healthcare Power of Attorney  Copy of advanced directive(s) in chart? Yes  Would patient like information on creating an advanced directive? -     Chief Complaint  Patient presents with  . Gout    HPI:  Pt is a 82 y.o. female seen today for an acute visit for pain in the left foot and ankle. Pt has a h/o gout, is on Uloric. Pain in foot started this morning. Ted hose on.    Past Medical History  Diagnosis Date  . Glaucoma   . Osteoporosis   . Hyperlipidemia   . Postmastectomy lymphedema   . Cataract   . Gout   . Hypertension   . Depression   . Anxiety   . Dementia   . Renal insufficiency   . Cancer [facility])     breast Left   Past Surgical History  Procedure Laterality Date  . Abdominal hysterectomy    . Breast surgery    . Eye surgery    . Left knee arthroscopy  09/19/2009    Dr. Margaretmary Eddy  . Knee arthroplasty Left 04/05/2016    Procedure: COMPUTER ASSISTED TOTAL KNEE ARTHROPLASTY;  Surgeon: Dereck Leep, MD;  Location: [facility] ORS;  Service: Orthopedics;  Laterality: Left;    No Known Allergies    Medication List       This list is accurate as  of: 04/15/16 11:06 PM.  Always use your most recent med list.               COLCRYS 0.6 MG tablet  Generic drug:  colchicine     COMBIGAN 0.2-0.5 % ophthalmic solution  Generic drug:  brimonidine-timolol  Place 1 drop into both eyes every 12 (twelve) hours.     donepezil 5 MG tablet  Commonly known as:  ARICEPT  Take 1 tablet (5 mg total) by mouth at bedtime.     dorzolamide 2 % ophthalmic solution  Commonly known as:  TRUSOPT  Place 1 drop into the left eye 2 (two) times daily.     enoxaparin 30 MG/0.3ML injection  Commonly known as:  LOVENOX  Inject 0.3 mLs (30 mg total) into the skin daily.     febuxostat 40 MG tablet  Commonly known as:  ULORIC  Take 1 tablet (40 mg total) by mouth daily.     hydrochlorothiazide 25 MG tablet  Commonly known as:  HYDRODIURIL  Take 1 tablet (25 mg total) by mouth daily.     losartan 50 MG tablet  Commonly known as:  COZAAR  Take 1 tablet (50 mg total) by mouth daily.     LUMIGAN 0.01 % Soln  Generic drug:  bimatoprost  Place 1 drop into both eyes at bedtime.  metoprolol 50 MG tablet  Commonly known as:  LOPRESSOR  Take 1 tablet (50 mg total) by mouth 2 (two) times daily.     oxyCODONE 5 MG immediate release tablet  Commonly known as:  Oxy IR/ROXICODONE  Take 1-2 tablets (5-10 mg total) by mouth every 4 (four) hours as needed for severe pain or breakthrough pain.     sodium chloride 5 % ophthalmic solution  Commonly known as:  MURO 128  Place 1 drop into both eyes as needed for irritation.     traMADol 50 MG tablet  Commonly known as:  ULTRAM  Take 1-2 tablets (50-100 mg total) by mouth every 4 (four) hours as needed for moderate pain.     VAYACOG 100-19.5-6.5 MG Caps  Generic drug:  Phosphatidylserine-DHA-EPA  TAKE 1 CAPSULE EVERY DAY        Review of Systems  Musculoskeletal: Positive for joint swelling and arthralgias.  All other systems reviewed and are negative.   Immunization History  Administered Date(s)  Administered  . Influenza-Unspecified 07/22/2014, 07/10/2015  . Pneumococcal Conjugate-13 05/20/2014  . Pneumococcal-Unspecified 01/16/1997, 07/06/2002  . Td 11/27/2014  . Zoster 03/08/2012   Pertinent  Health Maintenance Due  Topic Date Due  . INFLUENZA VACCINE  05/18/2016  . COLONOSCOPY  01/19/2018  . DEXA SCAN  Completed  . PNA vac Low Risk Adult  Completed   Fall Risk  12/01/2015  Falls in the past year? No   Functional Status Survey:    There were no vitals filed for this visit. There is no weight on file to calculate BMI. Physical Exam  Cardiovascular: Intact distal pulses.   Pulses:      Dorsalis pedis pulses are 1+ on the right side, and 1+ on the left side.  Musculoskeletal:       Left foot: There is decreased range of motion, tenderness and swelling.       Feet:  Skin: There is erythema (left ankle, side foot; warmth).  Nursing note and vitals reviewed.   Labs reviewed:  Recent Labs  04/06/16 0351 04/07/16 0634 04/13/16 0928  NA 137 139 137  K 3.6 3.2* 3.7  CL 109 109 101  CO2 23 23 27   GLUCOSE 103* 98 143*  BUN 26* 25* 23*  CREATININE 1.56* 1.77* 1.49*  CALCIUM 8.2* 8.4* 9.1    Recent Labs  12/01/15 1357 03/24/16 1439 04/13/16 0928  AST 11 14* 25  ALT 8 9* 15  ALKPHOS 58 107 89  BILITOT 0.5 0.6 0.3  PROT 6.3 7.2 6.4*  ALBUMIN 4.0 3.9 3.2*    Recent Labs  12/01/15 1357  04/06/16 0351 04/07/16 0634 04/13/16 0928  WBC 5.8  < > 8.1 7.3 7.0  NEUTROABS 3.4  --   --   --  4.7  HGB  --   < > 10.0* 10.0* 10.7*  HCT 34.9  < > 29.1* 28.9* 31.3*  MCV 91  < > 86.6 86.5 86.2  PLT 229  < > 217 190 336  < > = values in this interval not displayed. Lab Results  Component Value Date   TSH 2.510 12/01/2015   No results found for: HGBA1C Lab Results  Component Value Date   CHOL 230* 12/01/2015   HDL 54 12/01/2015   LDLCALC 154* 12/01/2015   TRIG 112 12/01/2015   CHOLHDL 4.3 12/01/2015    Significant Diagnostic Results in last 30 days:    Dg Knee Left Port  04/05/2016  CLINICAL DATA:  Followup total  knee replacement EXAM: PORTABLE LEFT KNEE - 1-2 VIEW COMPARISON:  None. FINDINGS: Prosthetic components appear well positioned. No radiographically detectable complication. Suprapatellar drain in place. IMPRESSION: Good appearance following total knee replacement. Electronically Signed   By: Nelson Chimes M.D.   On: 04/05/2016 15:05    Assessment/Plan 1. Acute gout due to renal impairment involving left foot  Voltaren gel 1%- 2 grams to left ankle, side of foot QID for mild gout, pain  Colchicine 0.6 mg po BID x 5 days  Family/ staff Communication:   Total Time: 15 minutes  Documentation: 10 minutes  Face to Face: 5 minutes  Family/Phone: daughter present at assessment   Labs/tests ordered:  na  Vikki Ports, NP-C [facility] [address], [place] [address] Cell Phone (Mon-Fri 8am-5pm):  [phone] On Call:  [phone] & follow prompts after 5pm & weekends Office Phone:  [phone] Office Fax:  [phone]

## 2016-04-17 ENCOUNTER — Encounter
Admission: RE | Admit: 2016-04-17 | Discharge: 2016-04-17 | Disposition: A | Discharge status: Home / Self Care | Payer: Medicare Other | Source: Ambulatory Visit | Attending: Internal Medicine | Admitting: Internal Medicine

## 2016-04-17 DIAGNOSIS — I10 Essential (primary) hypertension: Secondary | ICD-10-CM | POA: Insufficient documentation

## 2016-04-17 DIAGNOSIS — E119 Type 2 diabetes mellitus without complications: Secondary | ICD-10-CM | POA: Insufficient documentation

## 2016-04-24 DIAGNOSIS — E119 Type 2 diabetes mellitus without complications: Secondary | ICD-10-CM | POA: Diagnosis not present

## 2016-04-24 DIAGNOSIS — I10 Essential (primary) hypertension: Secondary | ICD-10-CM | POA: Diagnosis not present

## 2016-04-24 LAB — CBC WITH DIFFERENTIAL/PLATELET
Basophils Absolute: 0.1 10*3/uL (ref 0–0.1)
Basophils Relative: 1 %
Eosinophils Absolute: 0.1 10*3/uL (ref 0–0.7)
Eosinophils Relative: 2 %
HEMATOCRIT: 31.9 % — AB (ref 35.0–47.0)
Hemoglobin: 10.9 g/dL — ABNORMAL LOW (ref 12.0–16.0)
LYMPHS PCT: 15 %
Lymphs Abs: 1 10*3/uL (ref 1.0–3.6)
MCH: 29.5 pg (ref 26.0–34.0)
MCHC: 34.1 g/dL (ref 32.0–36.0)
MCV: 86.4 fL (ref 80.0–100.0)
MONO ABS: 0.3 10*3/uL (ref 0.2–0.9)
MONOS PCT: 5 %
NEUTROS ABS: 5.3 10*3/uL (ref 1.4–6.5)
Neutrophils Relative %: 77 %
Platelets: 344 10*3/uL (ref 150–440)
RBC: 3.69 MIL/uL — ABNORMAL LOW (ref 3.80–5.20)
RDW: 13.3 % (ref 11.5–14.5)
WBC: 6.8 10*3/uL (ref 3.6–11.0)

## 2016-04-24 LAB — COMPREHENSIVE METABOLIC PANEL
ALK PHOS: 83 U/L (ref 38–126)
ALT: 10 U/L — ABNORMAL LOW (ref 14–54)
ANION GAP: 9 (ref 5–15)
AST: 15 U/L (ref 15–41)
Albumin: 3.4 g/dL — ABNORMAL LOW (ref 3.5–5.0)
BILIRUBIN TOTAL: 0.5 mg/dL (ref 0.3–1.2)
BUN: 35 mg/dL — AB (ref 6–20)
CO2: 25 mmol/L (ref 22–32)
Calcium: 9.4 mg/dL (ref 8.9–10.3)
Chloride: 102 mmol/L (ref 101–111)
Creatinine, Ser: 1.56 mg/dL — ABNORMAL HIGH (ref 0.44–1.00)
GFR calc Af Amer: 35 mL/min — ABNORMAL LOW (ref >60)
GFR calc non Af Amer: 30 mL/min — ABNORMAL LOW (ref >60)
GLUCOSE: 109 mg/dL — AB (ref 65–99)
POTASSIUM: 3.8 mmol/L (ref 3.5–5.1)
Sodium: 136 mmol/L (ref 135–145)
Total Protein: 6.7 g/dL (ref 6.5–8.1)

## 2016-04-24 LAB — GLUCOSE, CAPILLARY: GLUCOSE-CAPILLARY: 132 mg/dL — AB (ref 65–99)

## 2016-04-28 ENCOUNTER — Ambulatory Visit: Payer: Medicare Other | Admitting: Unknown Physician Specialty

## 2016-05-05 ENCOUNTER — Telehealth: Payer: Self-pay | Admitting: Family Medicine

## 2016-05-05 MED ORDER — HYDROCORTISONE 2.5 % RE CREA: 1.0000 "application " | TOPICAL_CREAM | Freq: Two times a day (BID) | RECTAL | Status: DC

## 2016-05-05 NOTE — Telephone Encounter (Signed)
Printed and ready to fax

## 2016-05-05 NOTE — Telephone Encounter (Signed)
Routing to providers

## 2016-05-05 NOTE — Telephone Encounter (Signed)
rx faxed

## 2016-05-05 NOTE — Telephone Encounter (Signed)
Kat from [employer] called would like to know if a PRN order for a hemrrhoid cream can be faxed to the facility. Fax # [phone]. Thanks.

## 2016-05-21 ENCOUNTER — Telehealth: Payer: Self-pay

## 2016-05-21 ENCOUNTER — Other Ambulatory Visit: Payer: Self-pay | Admitting: Family Medicine

## 2016-05-21 ENCOUNTER — Ambulatory Visit
Admission: RE | Admit: 2016-05-21 | Discharge: 2016-05-21 | Disposition: A | Discharge status: Home / Self Care | Payer: Medicare Other | Source: Ambulatory Visit | Attending: Family Medicine | Admitting: Family Medicine

## 2016-05-21 DIAGNOSIS — M79605 Pain in left leg: Secondary | ICD-10-CM | POA: Diagnosis present

## 2016-05-21 NOTE — Telephone Encounter (Signed)
Elmo Putt from [facility] called, the patients right leg is swollen from ankle half way up her lower leg and he left ankle is swollen also.  They wanted to know if patient should wait for her appointment on 05/31/16. Informed Dr.Cunning of what was going on she said that patient needs to go to urgent care to rule out a blood clot. Elmo Putt notified, agreed to inform patients daughter of the doctors recommendation.

## 2016-05-31 ENCOUNTER — Ambulatory Visit (INDEPENDENT_AMBULATORY_CARE_PROVIDER_SITE_OTHER): Payer: Medicare Other | Admitting: Family Medicine

## 2016-05-31 ENCOUNTER — Encounter: Payer: Self-pay | Admitting: Family Medicine

## 2016-05-31 VITALS — BP 119/68 | HR 66 | Temp 97.5°F | Ht 65.2 in | Wt 159.0 lb

## 2016-05-31 DIAGNOSIS — M10372 Gout due to renal impairment, left ankle and foot: Secondary | ICD-10-CM | POA: Diagnosis not present

## 2016-05-31 DIAGNOSIS — I129 Hypertensive chronic kidney disease with stage 1 through stage 4 chronic kidney disease, or unspecified chronic kidney disease: Secondary | ICD-10-CM

## 2016-05-31 DIAGNOSIS — I10 Essential (primary) hypertension: Secondary | ICD-10-CM

## 2016-05-31 DIAGNOSIS — R600 Localized edema: Secondary | ICD-10-CM

## 2016-05-31 DIAGNOSIS — N184 Chronic kidney disease, stage 4 (severe): Secondary | ICD-10-CM | POA: Diagnosis not present

## 2016-05-31 NOTE — Progress Notes (Signed)
BP 119/68 (BP Location: Right Arm, Patient Position: Sitting, Cuff Size: Normal)   Pulse 66   Temp 97.5 F (36.4 C)   Ht 5' 5.2" (1.656 m) Comment: with shoes  Wt 159 lb (72.1 kg) Comment: with shoes  SpO2 98%   BMI 26.30 kg/m    Subjective:    Patient ID: Katie Saunders, female    DOB: [birth year], 82 y.o.   MRN: [id number]  HPI: Katie Saunders is a 82 y.o. female  Follow-up surgery leg edema  Patient's had ultrasounds of her left leg which has edema which developed after knee replacement surgery. Ultrasound has been negative with no blood clots but leg is continued to swell. Patient has not used compression. He is familiar with compression as needs compression on her left arm status post lymph node resection years ago for breast cancer surgery.  Got through surgery fine renal function remained stable and has done well BMP pending today Will refer to nephrology to further evaluate renal function  Patient accompanied by her daughter who assists with history because of dementia  Dementia is actually improved with patient in a stable environment that safe safety was a big concern of hers prior to moving from her home.  Relevant past medical, surgical, family and social history reviewed and updated as indicated. Interim medical history since our last visit reviewed. Allergies and medications reviewed and updated.  Review of Systems  Constitutional: Negative.   Respiratory: Negative.   Cardiovascular: Negative.     Per HPI unless specifically indicated above     Objective:    BP 119/68 (BP Location: Right Arm, Patient Position: Sitting, Cuff Size: Normal)   Pulse 66   Temp 97.5 F (36.4 C)   Ht 5' 5.2" (1.656 m) Comment: with shoes  Wt 159 lb (72.1 kg) Comment: with shoes  SpO2 98%   BMI 26.30 kg/m   Wt Readings from Last 3 Encounters:  05/31/16 159 lb (72.1 kg)  04/05/16 159 lb (72.1 kg)  03/24/16 159 lb (72.1 kg)    Physical Exam  Constitutional: She is  oriented to person, place, and time. She appears well-developed and well-nourished. No distress.  HENT:  Head: Normocephalic and atraumatic.  Right Ear: Hearing normal.  Left Ear: Hearing normal.  Nose: Nose normal.  Eyes: Conjunctivae and lids are normal. Right eye exhibits no discharge. Left eye exhibits no discharge. No scleral icterus.  Pulmonary/Chest: Effort normal. No respiratory distress.  Musculoskeletal: Normal range of motion. She exhibits edema.  Right leg normal left leg 2+ edema around her ankle  Neurological: She is alert and oriented to person, place, and time.  Skin: Skin is intact. No rash noted.  Psychiatric: She has a normal mood and affect. Her speech is normal and behavior is normal. Judgment and thought content normal. Cognition and memory are normal.    Results for orders placed or performed during the hospital encounter of 04/17/16  Glucose, capillary  Result Value Ref Range   Glucose-Capillary 132 (H) 65 - 99 mg/dL  CBC with Differential/Platelet  Result Value Ref Range   WBC 6.8 3.6 - 11.0 K/uL   RBC 3.69 (L) 3.80 - 5.20 MIL/uL   Hemoglobin 10.9 (L) 12.0 - 16.0 g/dL   HCT 31.9 (L) 35.0 - 47.0 %   MCV 86.4 80.0 - 100.0 fL   MCH 29.5 26.0 - 34.0 pg   MCHC 34.1 32.0 - 36.0 g/dL   RDW 13.3 11.5 - 14.5 %   Platelets 344  150 - 440 K/uL   Neutrophils Relative % 77 %   Neutro Abs 5.3 1.4 - 6.5 K/uL   Lymphocytes Relative 15 %   Lymphs Abs 1.0 1.0 - 3.6 K/uL   Monocytes Relative 5 %   Monocytes Absolute 0.3 0.2 - 0.9 K/uL   Eosinophils Relative 2 %   Eosinophils Absolute 0.1 0 - 0.7 K/uL   Basophils Relative 1 %   Basophils Absolute 0.1 0 - 0.1 K/uL  Comprehensive metabolic panel  Result Value Ref Range   Sodium 136 135 - 145 mmol/L   Potassium 3.8 3.5 - 5.1 mmol/L   Chloride 102 101 - 111 mmol/L   CO2 25 22 - 32 mmol/L   Glucose, Bld 109 (H) 65 - 99 mg/dL   BUN 35 (H) 6 - 20 mg/dL   Creatinine, Ser 1.56 (H) 0.44 - 1.00 mg/dL   Calcium 9.4 8.9 - 10.3  mg/dL   Total Protein 6.7 6.5 - 8.1 g/dL   Albumin 3.4 (L) 3.5 - 5.0 g/dL   AST 15 15 - 41 U/L   ALT 10 (L) 14 - 54 U/L   Alkaline Phosphatase 83 38 - 126 U/L   Total Bilirubin 0.5 0.3 - 1.2 mg/dL   GFR calc non Af Amer 30 (L) >60 mL/min   GFR calc Af Amer 35 (L) >60 mL/min   Anion gap 9 5 - 15      Assessment & Plan:   Problem List Items Addressed This Visit      Cardiovascular and Mediastinum   Essential hypertension - Primary    The current medical regimen is effective;  continue present plan and medications.       Relevant Orders   Basic metabolic panel   Uric acid     Genitourinary   CKD stage 4 secondary to hypertension ([place])    Has remained stable even status post surgery will refer to nephrology to further evaluate      Relevant Orders   Ambulatory referral to Nephrology     Other   Gout   Relevant Orders   Basic metabolic panel   Uric acid   Leg edema, left    Edema secondary to status post knee surgery  Discuss compression elevation support hose       Other Visit Diagnoses   None.      Follow up plan: Return if symptoms worsen or fail to improve, for 4 months.

## 2016-05-31 NOTE — Assessment & Plan Note (Addendum)
Edema secondary to status post knee surgery  Discuss compression elevation support hose

## 2016-05-31 NOTE — Assessment & Plan Note (Signed)
The current medical regimen is effective;  continue present plan and medications.  

## 2016-05-31 NOTE — Assessment & Plan Note (Signed)
Has remained stable even status post surgery will refer to nephrology to further evaluate

## 2016-06-01 ENCOUNTER — Ambulatory Visit: Payer: Medicare Other | Admitting: Family Medicine

## 2016-06-01 ENCOUNTER — Encounter: Payer: Self-pay | Admitting: Family Medicine

## 2016-06-01 LAB — BASIC METABOLIC PANEL
BUN/Creatinine Ratio: 22 (ref 12–28)
BUN: 28 mg/dL — ABNORMAL HIGH (ref 8–27)
CALCIUM: 9.9 mg/dL (ref 8.7–10.3)
CO2: 21 mmol/L (ref 18–29)
Chloride: 101 mmol/L (ref 96–106)
Creatinine, Ser: 1.3 mg/dL — ABNORMAL HIGH (ref 0.57–1.00)
GFR calc Af Amer: 44 mL/min/{1.73_m2} — ABNORMAL LOW (ref >59)
GFR, EST NON AFRICAN AMERICAN: 38 mL/min/{1.73_m2} — AB (ref >59)
GLUCOSE: 89 mg/dL (ref 65–99)
POTASSIUM: 4.6 mmol/L (ref 3.5–5.2)
SODIUM: 138 mmol/L (ref 134–144)

## 2016-06-01 LAB — URIC ACID: URIC ACID: 4.1 mg/dL (ref 2.5–7.1)

## 2016-06-14 ENCOUNTER — Telehealth: Payer: Self-pay | Admitting: Family Medicine

## 2016-06-14 NOTE — Telephone Encounter (Signed)
Cat would like to get clarification of dosage on tramadol.

## 2016-06-14 NOTE — Telephone Encounter (Signed)
Per last office visit patient is not taking Tramadol,  [facility] called and the Tramadol order they have is for 1-2 tab PRN -  The order must be specific 1 or 2  Please re order or D/C

## 2016-06-22 NOTE — Telephone Encounter (Signed)
D/C order faxed to [facility]

## 2016-06-22 NOTE — Telephone Encounter (Signed)
D/C tramadol

## 2016-06-29 ENCOUNTER — Telehealth: Payer: Self-pay | Admitting: Family Medicine

## 2016-06-29 NOTE — Telephone Encounter (Signed)
Muro 5% eye drops, needs clarification on order.  They only have listed as PRN.  Please fax order to [phone]

## 2016-06-29 NOTE — Telephone Encounter (Signed)
Spoke with [facility], they need to find out directions from eye Dr.

## 2016-06-29 NOTE — Telephone Encounter (Signed)
Eye Dr needs to rx

## 2016-07-23 ENCOUNTER — Other Ambulatory Visit: Payer: Self-pay | Admitting: Family Medicine

## 2016-08-11 ENCOUNTER — Ambulatory Visit: Payer: Medicare Other | Admitting: Hematology and Oncology

## 2016-08-31 ENCOUNTER — Ambulatory Visit: Payer: Medicare Other | Admitting: Family Medicine

## 2016-09-14 ENCOUNTER — Other Ambulatory Visit: Payer: Self-pay | Admitting: Hematology and Oncology

## 2016-09-14 ENCOUNTER — Encounter: Payer: Self-pay | Admitting: Family Medicine

## 2016-09-14 ENCOUNTER — Ambulatory Visit (INDEPENDENT_AMBULATORY_CARE_PROVIDER_SITE_OTHER): Payer: Medicare Other | Admitting: Family Medicine

## 2016-09-14 DIAGNOSIS — I10 Essential (primary) hypertension: Secondary | ICD-10-CM

## 2016-09-14 MED ORDER — LOSARTAN POTASSIUM 100 MG PO TABS: 100.0000 mg | ORAL_TABLET | Freq: Every day | ORAL | 1 refills | Status: DC

## 2016-09-14 NOTE — Assessment & Plan Note (Signed)
Poor control of hypertension increase losartan to 100 mg

## 2016-09-14 NOTE — Progress Notes (Signed)
BP (!) 173/89 (BP Location: Right Arm, Patient Position: Sitting, Cuff Size: Large)   Pulse 74   Temp 97.9 F (36.6 C)   Wt 164 lb 6.4 oz (74.6 kg)   SpO2 98%   BMI 27.19 kg/m    Subjective:    Patient ID: Katie Saunders, female    DOB: March 22, 1931, 82 y.o.   MRN: [id number]  HPI: Katie Saunders is a 82 y.o. female  Recheck BP and dementia here with daughter. Daughter provides most of the history. Patient doing well in nursing home no real complaints on review blood pressure elevated Patient is concerned about hemorrhoids still has occasional hemorrhoid uses cream every day on chart review  Relevant past medical, surgical, family and social history reviewed and updated as indicated. Interim medical history since our last visit reviewed. Allergies and medications reviewed and updated.  Review of Systems  Constitutional: Negative.   Respiratory: Negative.   Cardiovascular: Negative.     Per HPI unless specifically indicated above     Objective:    BP (!) 173/89 (BP Location: Right Arm, Patient Position: Sitting, Cuff Size: Large)   Pulse 74   Temp 97.9 F (36.6 C)   Wt 164 lb 6.4 oz (74.6 kg)   SpO2 98%   BMI 27.19 kg/m   Wt Readings from Last 3 Encounters:  09/14/16 164 lb 6.4 oz (74.6 kg)  05/31/16 159 lb (72.1 kg)  04/05/16 159 lb (72.1 kg)    Physical Exam  Constitutional: She is oriented to person, place, and time. She appears well-developed and well-nourished. No distress.  HENT:  Head: Normocephalic and atraumatic.  Right Ear: Hearing normal.  Left Ear: Hearing normal.  Nose: Nose normal.  Eyes: Conjunctivae and lids are normal. Right eye exhibits no discharge. Left eye exhibits no discharge. No scleral icterus.  Cardiovascular: Normal rate, regular rhythm and normal heart sounds.   Pulmonary/Chest: Effort normal and breath sounds normal. No respiratory distress.  Genitourinary:  Genitourinary Comments: Large external skin tag hemorrhoid    Musculoskeletal: Normal range of motion.  Neurological: She is alert and oriented to person, place, and time.  Skin: Skin is intact. No rash noted.  Psychiatric: She has a normal mood and affect. Her speech is normal and behavior is normal. Judgment and thought content normal. Cognition and memory are normal.    Results for orders placed or performed in visit on 71/06/26  Basic metabolic panel  Result Value Ref Range   Glucose 89 65 - 99 mg/dL   BUN 28 (H) 8 - 27 mg/dL   Creatinine, Ser 1.30 (H) 0.57 - 1.00 mg/dL   GFR calc non Af Amer 38 (L) >59 mL/min/1.73   GFR calc Af Amer 44 (L) >59 mL/min/1.73   BUN/Creatinine Ratio 22 12 - 28   Sodium 138 134 - 144 mmol/L   Potassium 4.6 3.5 - 5.2 mmol/L   Chloride 101 96 - 106 mmol/L   CO2 21 18 - 29 mmol/L   Calcium 9.9 8.7 - 10.3 mg/dL  Uric acid  Result Value Ref Range   Uric Acid 4.1 2.5 - 7.1 mg/dL      Assessment & Plan:   Problem List Items Addressed This Visit      Cardiovascular and Mediastinum   Essential hypertension    Poor control of hypertension increase losartan to 100 mg      Relevant Medications   losartan (COZAAR) 100 MG tablet    Other Visit Diagnoses  Essential hypertension, benign       Relevant Medications   losartan (COZAAR) 100 MG tablet      4 hemorrhoid care will decrease use of Anusol to Monday through Friday and not use on Saturday Sunday Follow up plan: Return in about 3 months (around 12/15/2016) for Physical Exam.

## 2016-09-15 ENCOUNTER — Encounter: Payer: Self-pay | Admitting: Hematology and Oncology

## 2016-09-15 ENCOUNTER — Inpatient Hospital Stay: Payer: Medicare Other | Attending: Hematology and Oncology | Admitting: Hematology and Oncology

## 2016-09-15 ENCOUNTER — Telehealth: Payer: Self-pay

## 2016-09-15 ENCOUNTER — Inpatient Hospital Stay: Payer: Medicare Other

## 2016-09-15 ENCOUNTER — Other Ambulatory Visit: Payer: Self-pay | Admitting: Hematology and Oncology

## 2016-09-15 DIAGNOSIS — H409 Unspecified glaucoma: Secondary | ICD-10-CM | POA: Diagnosis not present

## 2016-09-15 DIAGNOSIS — Z9012 Acquired absence of left breast and nipple: Secondary | ICD-10-CM | POA: Insufficient documentation

## 2016-09-15 DIAGNOSIS — N184 Chronic kidney disease, stage 4 (severe): Secondary | ICD-10-CM | POA: Insufficient documentation

## 2016-09-15 DIAGNOSIS — E538 Deficiency of other specified B group vitamins: Secondary | ICD-10-CM | POA: Diagnosis not present

## 2016-09-15 DIAGNOSIS — F418 Other specified anxiety disorders: Secondary | ICD-10-CM | POA: Diagnosis not present

## 2016-09-15 DIAGNOSIS — I129 Hypertensive chronic kidney disease with stage 1 through stage 4 chronic kidney disease, or unspecified chronic kidney disease: Secondary | ICD-10-CM

## 2016-09-15 DIAGNOSIS — E785 Hyperlipidemia, unspecified: Secondary | ICD-10-CM | POA: Diagnosis not present

## 2016-09-15 DIAGNOSIS — D509 Iron deficiency anemia, unspecified: Secondary | ICD-10-CM | POA: Diagnosis present

## 2016-09-15 DIAGNOSIS — F0390 Unspecified dementia without behavioral disturbance: Secondary | ICD-10-CM | POA: Insufficient documentation

## 2016-09-15 DIAGNOSIS — K529 Noninfective gastroenteritis and colitis, unspecified: Secondary | ICD-10-CM

## 2016-09-15 DIAGNOSIS — D500 Iron deficiency anemia secondary to blood loss (chronic): Secondary | ICD-10-CM

## 2016-09-15 DIAGNOSIS — Z79899 Other long term (current) drug therapy: Secondary | ICD-10-CM

## 2016-09-15 DIAGNOSIS — K649 Unspecified hemorrhoids: Secondary | ICD-10-CM | POA: Diagnosis not present

## 2016-09-15 DIAGNOSIS — R197 Diarrhea, unspecified: Secondary | ICD-10-CM | POA: Diagnosis not present

## 2016-09-15 DIAGNOSIS — Z853 Personal history of malignant neoplasm of breast: Secondary | ICD-10-CM | POA: Diagnosis not present

## 2016-09-15 DIAGNOSIS — M109 Gout, unspecified: Secondary | ICD-10-CM | POA: Diagnosis not present

## 2016-09-15 LAB — CBC WITH DIFFERENTIAL/PLATELET
Basophils Absolute: 0.1 10*3/uL (ref 0–0.1)
Basophils Relative: 1 %
EOS ABS: 0.1 10*3/uL (ref 0–0.7)
EOS PCT: 3 %
HCT: 35.8 % (ref 35.0–47.0)
HEMOGLOBIN: 11.9 g/dL — AB (ref 12.0–16.0)
LYMPHS ABS: 1.6 10*3/uL (ref 1.0–3.6)
Lymphocytes Relative: 28 %
MCH: 28 pg (ref 26.0–34.0)
MCHC: 33.2 g/dL (ref 32.0–36.0)
MCV: 84.4 fL (ref 80.0–100.0)
MONO ABS: 0.4 10*3/uL (ref 0.2–0.9)
MONOS PCT: 7 %
NEUTROS PCT: 61 %
Neutro Abs: 3.6 10*3/uL (ref 1.4–6.5)
Platelets: 246 10*3/uL (ref 150–440)
RBC: 4.24 MIL/uL (ref 3.80–5.20)
RDW: 14.6 % — AB (ref 11.5–14.5)
WBC: 5.8 10*3/uL (ref 3.6–11.0)

## 2016-09-15 LAB — IRON AND TIBC
Iron: 65 ug/dL (ref 28–170)
Saturation Ratios: 20 % (ref 10.4–31.8)
TIBC: 327 ug/dL (ref 250–450)
UIBC: 262 ug/dL

## 2016-09-15 LAB — FERRITIN: FERRITIN: 38 ng/mL (ref 11–307)

## 2016-09-15 LAB — VITAMIN B12: Vitamin B-12: 213 pg/mL (ref 180–914)

## 2016-09-15 NOTE — Progress Notes (Signed)
[facility] CONSULT NOTE  Patient Care Team: Guadalupe Maple, MD as PCP - General (Family Medicine) Lollie Sails, MD as Consulting Physician (Gastroenterology) Robert Bellow, MD (General Surgery) Anthonette Legato, MD (Internal Medicine)  CHIEF COMPLAINTS/PURPOSE OF CONSULTATION:  Iron deficiency anemia  HISTORY OF PRESENTING ILLNESS:  Katie Saunders 82 y.o. female is here because of iron deficiency anemia  She was found to have abnormal CBC from recent blood work monitoring through her nephrologist's office. I reviewed her outside records. She is accompanied by her daughter. The patient have history of dementia She was noted to have evidence of anemia from August 2017 with hemoglobin of 10.1. On 07/23/2016, iron studies revealed iron saturation 10%, normal serum iron, ferritin of 54. She is noted to have chronic renal disease with estimated GFR of 25. She denies recent chest pain on exertion, shortness of breath on minimal exertion, pre-syncopal episodes, or palpitations. She had not noticed any recent bleeding such as epistaxis, hematuria or hematemesis. However, she is noted to have chronic hemorrhoidal bleeding The patient denies over the counter NSAID ingestion. She is not on antiplatelets agents. Her last colonoscopy was several years ago and she has hemorrhoids The patient had history of left breast cancer status post mastectomy She denies any pica and eats a variety of diet. She never donated blood or received blood transfusion The patient was prescribed oral iron supplements and she takes that regularly but that was discontinued due to profuse diarrhea with oral iron supplement. Her daughter mentions significant postprandial diarrhea after each meals but she has not lost any weight  MEDICAL HISTORY:  Past Medical History:  Diagnosis Date  . Anxiety   . Cancer [facility])    breast Left  . Cataract   . Dementia   . Depression   . Glaucoma   . Gout   .  Hyperlipidemia   . Osteoporosis   . Postmastectomy lymphedema   . Renal insufficiency     SURGICAL HISTORY: Past Surgical History:  Procedure Laterality Date  . ABDOMINAL HYSTERECTOMY    . BREAST SURGERY    . EYE SURGERY    . KNEE ARTHROPLASTY Left 04/05/2016   Procedure: COMPUTER ASSISTED TOTAL KNEE ARTHROPLASTY;  Surgeon: Dereck Leep, MD;  Location: [facility] ORS;  Service: Orthopedics;  Laterality: Left;  . Left knee arthroscopy  09/19/2009   Dr. Margaretmary Eddy    SOCIAL HISTORY: Social History   Social History  . Marital status: Widowed    Spouse name: N/A  . Number of children: N/A  . Years of education: N/A   Occupational History  . Not on file.   Social History Main Topics  . Smoking status: Never Smoker  . Smokeless tobacco: Never Used  . Alcohol use No  . Drug use: No  . Sexual activity: Not on file   Other Topics Concern  . Not on file   Social History Narrative  . No narrative on file    FAMILY HISTORY: Family History  Problem Relation Age of Onset  . Cancer Father     ALLERGIES:  has No Known Allergies.  MEDICATIONS:  Current Outpatient Prescriptions  Medication Sig Dispense Refill  . calcitRIOL (ROCALTROL) 0.25 MCG capsule     . COMBIGAN 0.2-0.5 % ophthalmic solution Place 1 drop into both eyes every 12 (twelve) hours.     . Diclofenac Sodium 3 % GEL Apply 2 g topically 4 (four) times daily.    Marland Kitchen donepezil (ARICEPT) 5 MG  tablet Take 1 tablet (5 mg total) by mouth at bedtime. 90 tablet 4  . dorzolamide (TRUSOPT) 2 % ophthalmic solution Place 1 drop into the left eye 2 (two) times daily.     . febuxostat (ULORIC) 40 MG tablet Take 1 tablet (40 mg total) by mouth daily. 90 tablet 4  . hydrochlorothiazide (HYDRODIURIL) 25 MG tablet Take 1 tablet (25 mg total) by mouth daily. 90 tablet 4  . hydrocortisone (ANUSOL-HC) 2.5 % rectal cream Place 1 application rectally 2 (two) times daily. 30 g 3  . losartan (COZAAR) 100 MG tablet Take 1 tablet (100 mg  total) by mouth daily. 90 tablet 1  . LUMIGAN 0.01 % SOLN Place 1 drop into both eyes at bedtime.     . metoprolol (LOPRESSOR) 50 MG tablet Take 1 tablet (50 mg total) by mouth 2 (two) times daily. 180 tablet 4  . VAYACOG 100-19.5-6.5 MG CAPS TAKE 1 CAPSULE EVERY DAY 30 capsule 12  . colchicine (COLCRYS) 0.6 MG tablet      No current facility-administered medications for this visit.     REVIEW OF SYSTEMS:   Constitutional: Denies fevers, chills or abnormal night sweats Eyes: Denies blurriness of vision, double vision or watery eyes Ears, nose, mouth, throat, and face: Denies mucositis or sore throat Respiratory: Denies cough, dyspnea or wheezes Cardiovascular: Denies palpitation, chest discomfort or lower extremity swelling Skin: Denies abnormal skin rashes Lymphatics: Denies new lymphadenopathy or easy bruising Neurological:Denies numbness, tingling or new weaknesses Behavioral/Psych: Mood is stable, no new changes  All other systems were reviewed with the patient and are negative.  PHYSICAL EXAMINATION: ECOG PERFORMANCE STATUS: 1 - Symptomatic but completely ambulatory  Vitals:   09/15/16 1024  BP: (!) 158/79  Pulse: 85  Resp: 18  Temp: (!) 96.8 F (36 C)   Filed Weights   09/15/16 1024  Weight: 159 lb 2.8 oz (72.2 kg)    GENERAL:alert, no distress and comfortable SKIN: skin color, texture, turgor are normal, no rashes or significant lesions EYES: normal, conjunctiva are pink and non-injected, sclera clear OROPHARYNX:no exudate, no erythema and lips, buccal mucosa, and tongue normal  NECK: supple, thyroid normal size, non-tender, without nodularity LYMPH:  no palpable lymphadenopathy in the cervical, axillary or inguinal LUNGS: clear to auscultation and percussion with normal breathing effort HEART: regular rate & rhythm and no murmurs and no lower extremity edema ABDOMEN:abdomen soft, non-tender and normal bowel sounds Musculoskeletal:no cyanosis of digits and no  clubbing  PSYCH: alert & oriented x 3 with fluent speech. The patient appears to have poor memory NEURO: no focal motor/sensory deficits Left breast examination reveals well-healed mastectomy scar with no other abnormalities. Normal breast exam on the right  ASSESSMENT & PLAN:  Iron deficiency anemia The patient was originally referred here for iron deficiency anemia with poor tolerance to oral iron supplement. My original plan would be to prescribe intravenous iron. However, at the time of dictation, repeat blood work and iron studies showed adequate replacement. She remain mildly anemic, likely due to anemia of chronic illness from chronic kidney disease. Her hemoglobin is well above 11 and she would not need additional treatment for now. I will proceed to cancel her intravenous iron and recommend close follow-up with PCP/nephrologist  B12 deficiency The patient has history of dementia and possible vitamin B 12 deficiency. I will check vitamin B-12 level and called the patient's daughter with test results  CKD stage 4 secondary to hypertension [facility]) she will continue current medical management. I  recommend close follow-up with primary care doctor and her nephrologist for medication adjustment.   Postprandial diarrhea She has postprandial diarrhea. Her recent oral iron supplement exacerbated diarrhea but even after discontinuation of oral iron supplement, she continues to have frequent diarrhea after meals. I recommend dietary modification and avoidance of dairy products as a first step see if she could be lactose intolerant. She needs close follow-up with her primary care doctor for further management  Orders Placed This Encounter  Procedures  . CBC with Differential/Platelet    Standing Status:   Future    Number of Occurrences:   1    Standing Expiration Date:   10/20/2017  . Ferritin    Standing Status:   Future    Number of Occurrences:   1    Standing Expiration Date:    10/20/2017  . Iron and TIBC    Standing Status:   Future    Number of Occurrences:   1    Standing Expiration Date:   10/20/2017  . Vitamin B12    Standing Status:   Future    Number of Occurrences:   1    Standing Expiration Date:   10/20/2017     All questions were answered. The patient knows to call the clinic with any problems, questions or concerns. I spent 30 minutes counseling the patient face to face. The total time spent in the appointment was 40 minutes and more than 50% was on counseling.     Heath Lark, MD 09/15/16 1:28 PM

## 2016-09-15 NOTE — Assessment & Plan Note (Signed)
The patient was originally referred here for iron deficiency anemia with poor tolerance to oral iron supplement. My original plan would be to prescribe intravenous iron. However, at the time of dictation, repeat blood work and iron studies showed adequate replacement. She remain mildly anemic, likely due to anemia of chronic illness from chronic kidney disease. Her hemoglobin is well above 11 and she would not need additional treatment for now. I will proceed to cancel her intravenous iron and recommend close follow-up with PCP/nephrologist

## 2016-09-15 NOTE — Telephone Encounter (Signed)
Daughter informed of results and the infusion appts will be cancelled.  She states that she does not need the lactose intolerance letter to turn in to the facility because when she spoke with the issue they are fine with the following dairy free diet without letter.

## 2016-09-15 NOTE — Assessment & Plan Note (Signed)
She has postprandial diarrhea. Her recent oral iron supplement exacerbated diarrhea but even after discontinuation of oral iron supplement, she continues to have frequent diarrhea after meals. I recommend dietary modification and avoidance of dairy products as a first step see if she could be lactose intolerant. She needs close follow-up with her primary care doctor for further management

## 2016-09-15 NOTE — Assessment & Plan Note (Signed)
The patient has history of dementia and possible vitamin B 12 deficiency. I will check vitamin B-12 level and called the patient's daughter with test results

## 2016-09-15 NOTE — Telephone Encounter (Signed)
Dr. Alvy Bimler would like the patient's daughter informed of lab results from today's appointment.    Patient no longer IDA and does not need iron infusions that have been scheduled (will send schedule message to cancel).  B12 level is pending and will let her know when results are received.  While here for appt they requested a note from MD to give to living assisted facility, [facility], for possible lactose intolerance that patient should follow a no dairy diet for a few weeks but they left before getting the letter.  Holding the letter today in hopes of receiving a return phone call and will ask if they wold like the letter mailed to them or pick up in office.

## 2016-09-15 NOTE — Assessment & Plan Note (Signed)
she will continue current medical management. I recommend close follow-up with primary care doctor and her nephrologist for medication adjustment.

## 2016-09-16 ENCOUNTER — Telehealth: Payer: Self-pay

## 2016-09-16 NOTE — Telephone Encounter (Signed)
Patient's daughter, [name], informed of B12 results.  She will inform the patient.

## 2016-09-29 ENCOUNTER — Ambulatory Visit: Payer: Medicare Other

## 2016-10-05 ENCOUNTER — Other Ambulatory Visit: Payer: Self-pay | Admitting: Family Medicine

## 2016-10-06 ENCOUNTER — Ambulatory Visit: Payer: Medicare Other

## 2016-10-15 ENCOUNTER — Telehealth: Payer: Self-pay | Admitting: Family Medicine

## 2016-10-15 NOTE — Telephone Encounter (Signed)
Patient Voltaren Gel was refilled 10/05/16 w/ 2 refills by Dr. Jeananne Rama and sent to [facility].   Anderson Malta with [facility] notified.

## 2016-10-15 NOTE — Telephone Encounter (Signed)
Katie Saunders with [facility] called to request a refill on the patients Voltaren 1%gel 2 gms 4xs daily...  [facility]   [phone] [facility]

## 2016-11-10 ENCOUNTER — Ambulatory Visit: Payer: Medicare Other | Admitting: Family Medicine

## 2016-11-11 ENCOUNTER — Ambulatory Visit: Payer: Medicare Other | Admitting: Family Medicine

## 2016-11-12 ENCOUNTER — Telehealth: Payer: Self-pay | Admitting: Family Medicine

## 2016-11-12 ENCOUNTER — Ambulatory Visit (INDEPENDENT_AMBULATORY_CARE_PROVIDER_SITE_OTHER): Payer: Medicare Other | Admitting: Family Medicine

## 2016-11-12 ENCOUNTER — Encounter: Payer: Self-pay | Admitting: Family Medicine

## 2016-11-12 VITALS — BP 122/73 | HR 66 | Temp 97.6°F | Wt 161.0 lb

## 2016-11-12 DIAGNOSIS — B9789 Other viral agents as the cause of diseases classified elsewhere: Secondary | ICD-10-CM

## 2016-11-12 DIAGNOSIS — J069 Acute upper respiratory infection, unspecified: Secondary | ICD-10-CM

## 2016-11-12 MED ORDER — FLUTICASONE PROPIONATE 50 MCG/ACT NA SUSP: 2.0000 | Freq: Every day | NASAL | 6 refills | Status: DC

## 2016-11-12 MED ORDER — GUAIFENESIN ER 600 MG PO TB12: 600.0000 mg | ORAL_TABLET | Freq: Two times a day (BID) | ORAL | 0 refills | Status: DC

## 2016-11-12 NOTE — Progress Notes (Signed)
   BP 122/73   Pulse 66   Temp 97.6 F (36.4 C)   Wt 161 lb (73 kg)   SpO2 99%   BMI 26.63 kg/m    Subjective:    Patient ID: Katie Saunders, female    DOB: 09/26/1931, 83 y.o.   MRN: [id number]  HPI: Katie Saunders is a 83 y.o. female  Chief Complaint  Patient presents with  . URI    x 3 days, chest/head congestion, productive cough, runny nose. No fever, no sore throat.   About 4 days of congestion, productive cough, rhinorrhea. Denies sore throat, ear pain, fever, CP, SOB. Not taking anything OTC at this time. Denies sick contacts.   Relevant past medical, surgical, family and social history reviewed and updated as indicated. Interim medical history since our last visit reviewed. Allergies and medications reviewed and updated.  Review of Systems  Constitutional: Negative.   HENT: Positive for congestion and rhinorrhea.   Respiratory: Positive for cough.   Cardiovascular: Negative.   Gastrointestinal: Negative.   Genitourinary: Negative.   Musculoskeletal: Negative.   Neurological: Negative.   Psychiatric/Behavioral: Negative.     Per HPI unless specifically indicated above     Objective:    BP 122/73   Pulse 66   Temp 97.6 F (36.4 C)   Wt 161 lb (73 kg)   SpO2 99%   BMI 26.63 kg/m   Wt Readings from Last 3 Encounters:  11/12/16 161 lb (73 kg)  09/15/16 159 lb 2.8 oz (72.2 kg)  09/14/16 164 lb 6.4 oz (74.6 kg)    Physical Exam  HENT:  Head: Atraumatic.  Right Ear: External ear normal.  Left Ear: External ear normal.  Mouth/Throat: Oropharynx is clear and moist. No oropharyngeal exudate.  Nasal mucosa injected  Eyes: Conjunctivae are normal. Pupils are equal, round, and reactive to light.  Neck: Normal range of motion. Neck supple.  Cardiovascular: Normal rate.   Pulmonary/Chest: Effort normal and breath sounds normal. No respiratory distress.  Musculoskeletal: Normal range of motion.  Neurological: She is alert.  Skin: Skin is warm and dry.    Psychiatric: She has a normal mood and affect. Her behavior is normal.  Nursing note and vitals reviewed.     Assessment & Plan:   Problem List Items Addressed This Visit    None    Visit Diagnoses    Viral URI    -  Primary   Discussed supportive care. Mucinex and flonase sent to help with congestion. Follow up if no better by Monday.        Follow up plan: Return if symptoms worsen or fail to improve.

## 2016-11-14 NOTE — Patient Instructions (Signed)
Follow up as needed

## 2016-11-15 NOTE — Telephone Encounter (Signed)
Use guaifenesin which the patient has in her medicine list.

## 2016-11-15 NOTE — Telephone Encounter (Signed)
Please advise 

## 2016-12-02 ENCOUNTER — Other Ambulatory Visit: Payer: Self-pay | Admitting: Family Medicine

## 2016-12-02 NOTE — Telephone Encounter (Signed)
Last (acute) OV: 11/12/16 Last routine OV: 09/14/16 Next OV: 01/24/17

## 2016-12-09 ENCOUNTER — Encounter: Payer: Medicare Other | Admitting: Family Medicine

## 2016-12-28 ENCOUNTER — Other Ambulatory Visit: Payer: Self-pay | Admitting: Family Medicine

## 2016-12-28 DIAGNOSIS — M10372 Gout due to renal impairment, left ankle and foot: Secondary | ICD-10-CM

## 2016-12-28 DIAGNOSIS — I10 Essential (primary) hypertension: Secondary | ICD-10-CM

## 2017-01-24 ENCOUNTER — Encounter: Payer: Medicare Other | Admitting: Family Medicine

## 2017-01-25 ENCOUNTER — Other Ambulatory Visit: Payer: Self-pay | Admitting: Family Medicine

## 2017-01-25 DIAGNOSIS — I10 Essential (primary) hypertension: Secondary | ICD-10-CM

## 2017-01-26 ENCOUNTER — Telehealth: Payer: Self-pay | Admitting: Family Medicine

## 2017-01-26 NOTE — Telephone Encounter (Signed)
Patient has been having bouts of diarrhea and she is a resident at [facility], they need someone to call over there and give verbal orders to the nurse Kat to give the patient Immodium.   Thanks

## 2017-01-27 NOTE — Telephone Encounter (Signed)
Attempted to give verbal to [facility]. She is at lunch. She will call back per receptionist.

## 2017-01-27 NOTE — Telephone Encounter (Signed)
Spoke with Wendelyn Breslow, cannot take verbals. Telephone encounter faxed to [facility] @ [phone]

## 2017-01-27 NOTE — Telephone Encounter (Signed)
Okay to give verbal orders for Immodium? Please advise.

## 2017-01-27 NOTE — Telephone Encounter (Signed)
That's fine

## 2017-02-21 ENCOUNTER — Other Ambulatory Visit: Payer: Self-pay | Admitting: Family Medicine

## 2017-02-21 DIAGNOSIS — I10 Essential (primary) hypertension: Secondary | ICD-10-CM

## 2017-02-21 NOTE — Telephone Encounter (Signed)
Last OV: 11/12/16 Next OV: 02/23/17  BMP Latest Ref Rng & Units 05/31/2016 04/24/2016 04/13/2016  Glucose 65 - 99 mg/dL 89 109(H) 143(H)  BUN 8 - 27 mg/dL 28(H) 35(H) 23(H)  Creatinine 0.57 - 1.00 mg/dL 1.30(H) 1.56(H) 1.49(H)  BUN/Creat Ratio 12 - 28 22 - -  Sodium 134 - 144 mmol/L 138 136 137  Potassium 3.5 - 5.2 mmol/L 4.6 3.8 3.7  Chloride 96 - 106 mmol/L 101 102 101  CO2 18 - 29 mmol/L 21 25 27   Calcium 8.7 - 10.3 mg/dL 9.9 9.4 9.1

## 2017-02-23 ENCOUNTER — Ambulatory Visit (INDEPENDENT_AMBULATORY_CARE_PROVIDER_SITE_OTHER): Payer: Medicare Other | Admitting: Family Medicine

## 2017-02-23 ENCOUNTER — Encounter: Payer: Self-pay | Admitting: Family Medicine

## 2017-02-23 VITALS — BP 122/71 | HR 56 | Wt 169.0 lb

## 2017-02-23 DIAGNOSIS — N184 Chronic kidney disease, stage 4 (severe): Secondary | ICD-10-CM

## 2017-02-23 DIAGNOSIS — E785 Hyperlipidemia, unspecified: Secondary | ICD-10-CM | POA: Diagnosis not present

## 2017-02-23 DIAGNOSIS — I10 Essential (primary) hypertension: Secondary | ICD-10-CM | POA: Diagnosis not present

## 2017-02-23 DIAGNOSIS — M10372 Gout due to renal impairment, left ankle and foot: Secondary | ICD-10-CM

## 2017-02-23 DIAGNOSIS — Z1329 Encounter for screening for other suspected endocrine disorder: Secondary | ICD-10-CM

## 2017-02-23 DIAGNOSIS — Z13220 Encounter for screening for lipoid disorders: Secondary | ICD-10-CM

## 2017-02-23 DIAGNOSIS — I129 Hypertensive chronic kidney disease with stage 1 through stage 4 chronic kidney disease, or unspecified chronic kidney disease: Secondary | ICD-10-CM

## 2017-02-23 DIAGNOSIS — F0280 Dementia in other diseases classified elsewhere without behavioral disturbance: Secondary | ICD-10-CM | POA: Diagnosis not present

## 2017-02-23 DIAGNOSIS — Z0000 Encounter for general adult medical examination without abnormal findings: Secondary | ICD-10-CM

## 2017-02-23 DIAGNOSIS — Z7189 Other specified counseling: Secondary | ICD-10-CM | POA: Diagnosis not present

## 2017-02-23 DIAGNOSIS — G301 Alzheimer's disease with late onset: Secondary | ICD-10-CM

## 2017-02-23 LAB — URINALYSIS, ROUTINE W REFLEX MICROSCOPIC
Bilirubin, UA: NEGATIVE
GLUCOSE, UA: NEGATIVE
KETONES UA: NEGATIVE
Nitrite, UA: NEGATIVE
Protein, UA: NEGATIVE
SPEC GRAV UA: 1.02 (ref 1.005–1.030)
UUROB: 0.2 mg/dL (ref 0.2–1.0)
pH, UA: 5.5 (ref 5.0–7.5)

## 2017-02-23 LAB — MICROSCOPIC EXAMINATION
Bacteria, UA: NONE SEEN
RBC MICROSCOPIC, UA: NONE SEEN /HPF (ref 0–?)

## 2017-02-23 NOTE — Assessment & Plan Note (Signed)
A voluntary discussion about advance care planning including the explanation and discussion of advance directives was extensively discussed  with the patient.  Explanation about the health care proxy and Living will was reviewed and packet with forms with explanation of how to fill them out was given.  In discussion with patient's daughter who has healthcare power of attorney this is already been done and in place.

## 2017-02-23 NOTE — Assessment & Plan Note (Signed)
The current medical regimen is effective;  continue present plan and medications.  

## 2017-02-23 NOTE — Progress Notes (Signed)
BP 122/71   Pulse (!) 56   Wt 169 lb (76.7 kg)   SpO2 98%   BMI 27.95 kg/m    Subjective:    Patient ID: Katie Saunders, female    DOB: November 22, 1930, 83 y.o.   MRN: [id number]  HPI: Katie Saunders is a 83 y.o. female  Annual exam AWV metrics met  Patient accompanied by daughter who provides history as patient is incapable due to dementia from Alzheimer's. Patient with no complaints maybe some gout symptoms in her toe Saw nephrology today and has made active decision not to do dialysis. I reviewed this decision with the family as appropriate. Reviewed patient's medication and stable we will stop diclofenac gel is not using.  Relevant past medical, surgical, family and social history reviewed and updated as indicated. Interim medical history since our last visit reviewed. Allergies and medications reviewed and updated.  Review of Systems  Unable to perform ROS: Dementia    Per HPI unless specifically indicated above     Objective:    BP 122/71   Pulse (!) 56   Wt 169 lb (76.7 kg)   SpO2 98%   BMI 27.95 kg/m   Wt Readings from Last 3 Encounters:  02/23/17 169 lb (76.7 kg)  11/12/16 161 lb (73 kg)  09/15/16 159 lb 2.8 oz (72.2 kg)    Physical Exam  Constitutional: She is oriented to person, place, and time. She appears well-developed and well-nourished.  HENT:  Head: Normocephalic and atraumatic.  Right Ear: External ear normal.  Left Ear: External ear normal.  Nose: Nose normal.  Mouth/Throat: Oropharynx is clear and moist.  Eyes: Conjunctivae and EOM are normal. Pupils are equal, round, and reactive to light.  Neck: Normal range of motion. Neck supple. Carotid bruit is not present.  Cardiovascular: Normal rate, regular rhythm and normal heart sounds.   No murmur heard. Pulmonary/Chest: Effort normal and breath sounds normal. She exhibits no mass. Right breast exhibits no mass, no skin change and no tenderness. Left breast exhibits no mass, no skin change and  no tenderness. Breasts are symmetrical.  Abdominal: Soft. Bowel sounds are normal. There is no hepatosplenomegaly.  Musculoskeletal: Normal range of motion.  Neurological: She is alert and oriented to person, place, and time.  Skin: No rash noted.  Psychiatric: She has a normal mood and affect. Her behavior is normal.    Results for orders placed or performed in visit on 09/15/16  CBC with Differential/Platelet  Result Value Ref Range   WBC 5.8 3.6 - 11.0 K/uL   RBC 4.24 3.80 - 5.20 MIL/uL   Hemoglobin 11.9 (L) 12.0 - 16.0 g/dL   HCT 35.8 35.0 - 47.0 %   MCV 84.4 80.0 - 100.0 fL   MCH 28.0 26.0 - 34.0 pg   MCHC 33.2 32.0 - 36.0 g/dL   RDW 14.6 (H) 11.5 - 14.5 %   Platelets 246 150 - 440 K/uL   Neutrophils Relative % 61 %   Neutro Abs 3.6 1.4 - 6.5 K/uL   Lymphocytes Relative 28 %   Lymphs Abs 1.6 1.0 - 3.6 K/uL   Monocytes Relative 7 %   Monocytes Absolute 0.4 0.2 - 0.9 K/uL   Eosinophils Relative 3 %   Eosinophils Absolute 0.1 0 - 0.7 K/uL   Basophils Relative 1 %   Basophils Absolute 0.1 0 - 0.1 K/uL  Ferritin  Result Value Ref Range   Ferritin 38 11 - 307 ng/mL  Iron and TIBC  Result Value Ref Range   Iron 65 28 - 170 ug/dL   TIBC 327 250 - 450 ug/dL   Saturation Ratios 20 10.4 - 31.8 %   UIBC 262 ug/dL  Vitamin B12  Result Value Ref Range   Vitamin B-12 213 180 - 914 pg/mL      Assessment & Plan:   Problem List Items Addressed This Visit      Cardiovascular and Mediastinum   Essential hypertension    The current medical regimen is effective;  continue present plan and medications.       Relevant Orders   CBC with Differential/Platelet   Comprehensive metabolic panel   Urinalysis, Routine w reflex microscopic     Nervous and Auditory   Alzheimer's dementia    The current medical regimen is effective;  continue present plan and medications.         Genitourinary   CKD stage 4 secondary to hypertension [facility])    The current medical regimen is  effective;  continue present plan and medications.       Relevant Orders   CBC with Differential/Platelet   Comprehensive metabolic panel   Urinalysis, Routine w reflex microscopic     Other   Hyperlipidemia   Relevant Orders   CBC with Differential/Platelet   Comprehensive metabolic panel   Lipid panel   Urinalysis, Routine w reflex microscopic   Gout    We'll check uric acid patient having some total complaints.      Relevant Orders   Uric acid   Advanced care planning/counseling discussion    A voluntary discussion about advance care planning including the explanation and discussion of advance directives was extensively discussed  with the patient.  Explanation about the health care proxy and Living will was reviewed and packet with forms with explanation of how to fill them out was given.  In discussion with patient's daughter who has healthcare power of attorney this is already been done and in place.        Other Visit Diagnoses    Annual physical exam    -  Primary   Relevant Orders   CBC with Differential/Platelet   Comprehensive metabolic panel   Lipid panel   TSH   Urinalysis, Routine w reflex microscopic   Screening cholesterol level       Relevant Orders   Lipid panel   Thyroid disorder screen       Relevant Orders   TSH       Follow up plan: Return for  recheck demeentia.

## 2017-02-23 NOTE — Assessment & Plan Note (Signed)
We'll check uric acid patient having some total complaints.

## 2017-02-24 ENCOUNTER — Encounter: Payer: Self-pay | Admitting: Family Medicine

## 2017-02-24 LAB — CBC WITH DIFFERENTIAL/PLATELET
Basophils Absolute: 0 10*3/uL (ref 0.0–0.2)
Basos: 1 %
EOS (ABSOLUTE): 0.1 10*3/uL (ref 0.0–0.4)
Eos: 2 %
HEMOGLOBIN: 11.3 g/dL (ref 11.1–15.9)
Hematocrit: 34.6 % (ref 34.0–46.6)
IMMATURE GRANS (ABS): 0 10*3/uL (ref 0.0–0.1)
Immature Granulocytes: 0 %
LYMPHS ABS: 1.7 10*3/uL (ref 0.7–3.1)
Lymphs: 31 %
MCH: 28.8 pg (ref 26.6–33.0)
MCHC: 32.7 g/dL (ref 31.5–35.7)
MCV: 88 fL (ref 79–97)
Monocytes Absolute: 0.3 10*3/uL (ref 0.1–0.9)
Monocytes: 6 %
NEUTROS PCT: 60 %
Neutrophils Absolute: 3.3 10*3/uL (ref 1.4–7.0)
Platelets: 235 10*3/uL (ref 150–379)
RBC: 3.92 x10E6/uL (ref 3.77–5.28)
RDW: 14.4 % (ref 12.3–15.4)
WBC: 5.4 10*3/uL (ref 3.4–10.8)

## 2017-02-24 LAB — LIPID PANEL
CHOLESTEROL TOTAL: 206 mg/dL — AB (ref 100–199)
Chol/HDL Ratio: 3.9 ratio (ref 0.0–4.4)
HDL: 53 mg/dL (ref >39)
LDL CALC: 130 mg/dL — AB (ref 0–99)
Triglycerides: 117 mg/dL (ref 0–149)
VLDL CHOLESTEROL CAL: 23 mg/dL (ref 5–40)

## 2017-02-24 LAB — COMPREHENSIVE METABOLIC PANEL
ALBUMIN: 3.9 g/dL (ref 3.5–4.7)
ALK PHOS: 83 IU/L (ref 39–117)
ALT: 9 IU/L (ref 0–32)
AST: 13 IU/L (ref 0–40)
Albumin/Globulin Ratio: 1.6 (ref 1.2–2.2)
BILIRUBIN TOTAL: 0.4 mg/dL (ref 0.0–1.2)
BUN / CREAT RATIO: 20 (ref 12–28)
BUN: 35 mg/dL — ABNORMAL HIGH (ref 8–27)
CHLORIDE: 105 mmol/L (ref 96–106)
CO2: 24 mmol/L (ref 18–29)
CREATININE: 1.76 mg/dL — AB (ref 0.57–1.00)
Calcium: 9.4 mg/dL (ref 8.7–10.3)
GFR calc non Af Amer: 26 mL/min/{1.73_m2} — ABNORMAL LOW (ref >59)
GFR, EST AFRICAN AMERICAN: 30 mL/min/{1.73_m2} — AB (ref >59)
Globulin, Total: 2.4 g/dL (ref 1.5–4.5)
Glucose: 80 mg/dL (ref 65–99)
Potassium: 4.4 mmol/L (ref 3.5–5.2)
Sodium: 144 mmol/L (ref 134–144)
Total Protein: 6.3 g/dL (ref 6.0–8.5)

## 2017-02-24 LAB — TSH: TSH: 2.83 u[IU]/mL (ref 0.450–4.500)

## 2017-02-24 LAB — URIC ACID: URIC ACID: 4.1 mg/dL (ref 2.5–7.1)

## 2017-03-18 ENCOUNTER — Other Ambulatory Visit: Payer: Self-pay | Admitting: Family Medicine

## 2017-03-18 DIAGNOSIS — I10 Essential (primary) hypertension: Secondary | ICD-10-CM

## 2017-03-24 ENCOUNTER — Telehealth: Payer: Self-pay | Admitting: Family Medicine

## 2017-03-24 DIAGNOSIS — M79673 Pain in unspecified foot: Secondary | ICD-10-CM

## 2017-03-24 NOTE — Telephone Encounter (Signed)
Referral placed.

## 2017-03-24 NOTE — Telephone Encounter (Signed)
Patient needs a referral to a podiatrist for a problem she is having with her toe/toenail.  Thanks  [phone] daughter [name]

## 2017-03-24 NOTE — Telephone Encounter (Signed)
Per last OV note from 02/23/17, "Patient with no complaints maybe some gout symptoms in her toe" Uric acid levels did come back in the normal range. Please advise.

## 2017-03-24 NOTE — Telephone Encounter (Signed)
Referral okay? 

## 2017-04-14 ENCOUNTER — Telehealth: Payer: Self-pay | Admitting: Family Medicine

## 2017-04-14 NOTE — Telephone Encounter (Signed)
Should pt come in here for hearing screen, or okay to refer out? Please advise.

## 2017-04-14 NOTE — Telephone Encounter (Signed)
Message relayed to patient. Verbalized understanding and denied questions.   

## 2017-04-14 NOTE — Telephone Encounter (Signed)
[facility] Address: [address], [place], [place] [address] Hours: Closes soon: 5PM ? [facility] Phone: [phone]

## 2017-04-14 NOTE — Telephone Encounter (Signed)
Pts daughter called and would like to know if a referral could be placed for a hearing screening.

## 2017-04-14 NOTE — Telephone Encounter (Signed)
She should be on to get an appointment with audiology at [facility] without a referral.

## 2017-04-25 ENCOUNTER — Ambulatory Visit: Payer: Medicare Other | Admitting: Podiatry

## 2017-05-16 ENCOUNTER — Ambulatory Visit (INDEPENDENT_AMBULATORY_CARE_PROVIDER_SITE_OTHER): Payer: Medicare Other | Admitting: Podiatry

## 2017-05-16 VITALS — BP 148/78 | HR 99 | Resp 16

## 2017-05-16 DIAGNOSIS — C50919 Malignant neoplasm of unspecified site of unspecified female breast: Secondary | ICD-10-CM | POA: Insufficient documentation

## 2017-05-16 DIAGNOSIS — M79674 Pain in right toe(s): Secondary | ICD-10-CM | POA: Diagnosis not present

## 2017-05-16 DIAGNOSIS — I890 Lymphedema, not elsewhere classified: Secondary | ICD-10-CM | POA: Insufficient documentation

## 2017-05-16 DIAGNOSIS — B351 Tinea unguium: Secondary | ICD-10-CM | POA: Diagnosis not present

## 2017-05-16 NOTE — Progress Notes (Signed)
Subjective:     Patient ID: Katie Saunders, female   DOB: 05/03/1931, 83 y.o.   MRN: [id number]  HPI this patient presents to the office with chief complaint of a painful second toenail right foot..  She says this is painful walking and wearing her shoes.  Patient is unable to self treat.  She is brought to the office by her son.  She does have a history of knee surgery and gout.  She presents the office today for an evaluation and treatment of her painful second toe right foot   Review of Systems     Objective:   Physical Exam GENERAL APPEARANCE: Alert, conversant. Appropriately groomed. No acute distress.  VASCULAR: Pedal pulses are  palpable at  [facility] and PT bilateral.  Capillary refill time is immediate to all digits,  Normal temperature gradient.  Digital hair growth is present bilateral  NEUROLOGIC: sensation is normal to 5.07 monofilament at 5/5 sites bilateral.  Light touch is intact bilateral, Muscle strength normal.  MUSCULOSKELETAL: acceptable muscle strength, tone and stability bilateral.  HAV and HT second right foot. NAILS  thick disfigured discolored second toenail right foot with no evidence of bacterial infection or drainage DERMATOLOGIC: skin color, texture, and turgor are within normal limits.  No preulcerative lesions or ulcers  are seen, no interdigital maceration noted.  No open lesions present.  Digital nails are asymptomatic. No drainage noted.      Assessment:     Onychomycosis  2nd right    Plan:     IE  Debride nail.  RTC 4 months   Gardiner Barefoot DPM

## 2017-06-01 IMAGING — DX DG KNEE 1-2V PORT*L*
2 series · 2 of 2 positions shown · non-contrast
Comparison: None.

CLINICAL DATA: Followup total knee replacement

EXAM:
PORTABLE LEFT KNEE - 1-2 VIEW

[knee ap]
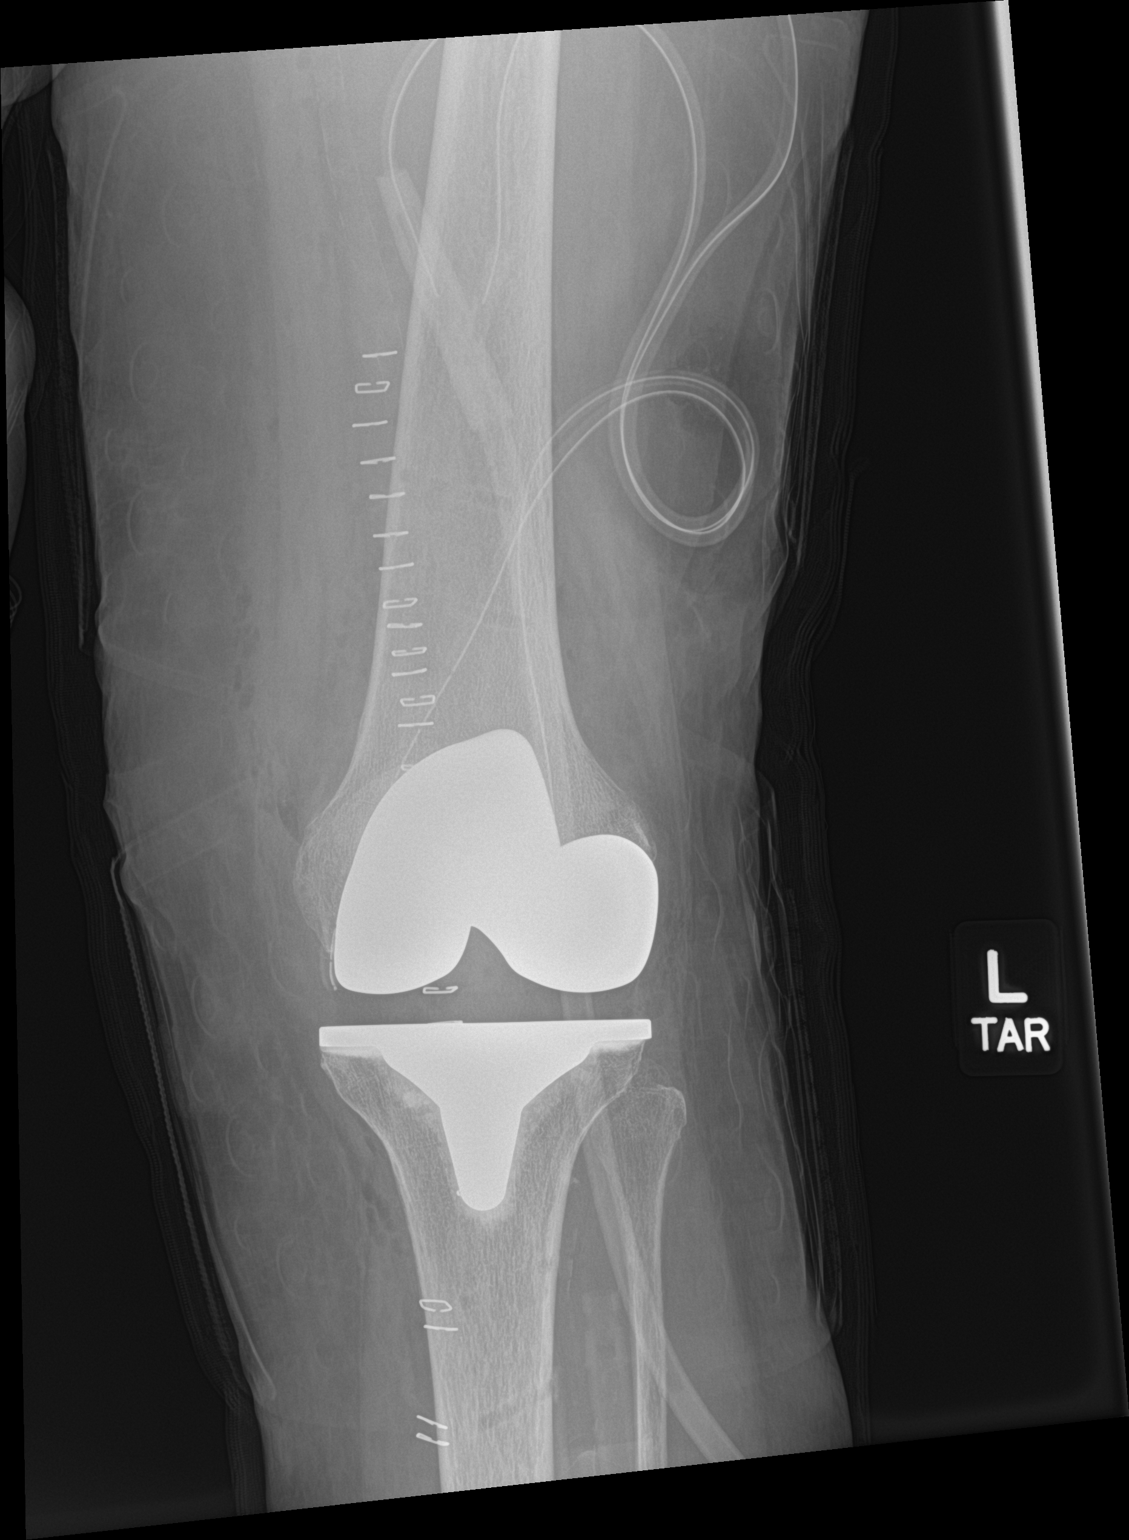

[knee lat]
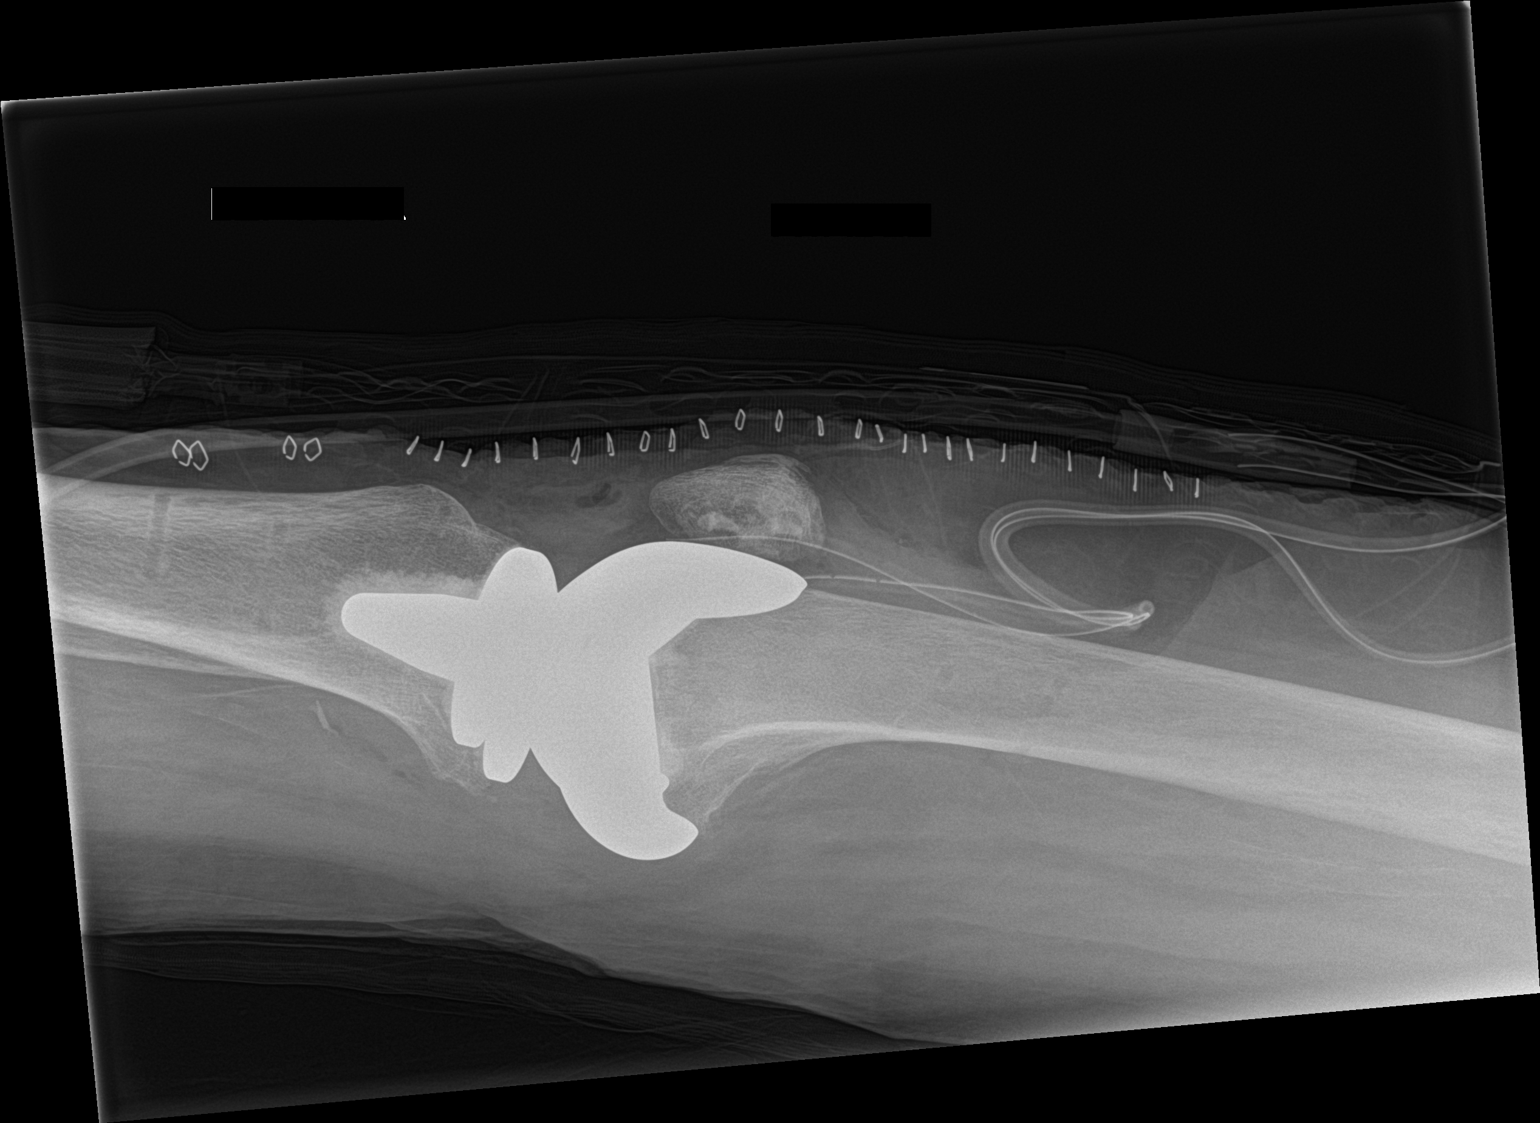

[2 of 2 positions shown; findings below may reference images not displayed]

FINDINGS: Prosthetic components appear well positioned. No radiographically
detectable complication. Suprapatellar drain in place.
IMPRESSION: Good appearance following total knee replacement.

## 2017-06-21 ENCOUNTER — Other Ambulatory Visit: Payer: Self-pay | Admitting: Family Medicine

## 2017-06-21 ENCOUNTER — Telehealth: Payer: Self-pay | Admitting: Family Medicine

## 2017-06-21 DIAGNOSIS — M25532 Pain in left wrist: Secondary | ICD-10-CM

## 2017-06-21 DIAGNOSIS — M79602 Pain in left arm: Secondary | ICD-10-CM

## 2017-06-21 NOTE — Telephone Encounter (Signed)
Order written and faxed.

## 2017-06-21 NOTE — Telephone Encounter (Signed)
Patient is a resident of [facility] and fell bruising her wrist/arm.  They are requesting an order to have this xrayed. The fax number is below.  [phone]    Fax # [phone] Attn Vaughan Basta  Thank You

## 2017-06-28 ENCOUNTER — Other Ambulatory Visit: Payer: Self-pay | Admitting: Family Medicine

## 2017-06-28 DIAGNOSIS — M10372 Gout due to renal impairment, left ankle and foot: Secondary | ICD-10-CM

## 2017-06-28 DIAGNOSIS — I10 Essential (primary) hypertension: Secondary | ICD-10-CM

## 2017-06-28 NOTE — Telephone Encounter (Signed)
Your patient 

## 2017-08-31 ENCOUNTER — Other Ambulatory Visit: Payer: Self-pay | Admitting: Unknown Physician Specialty

## 2017-08-31 DIAGNOSIS — I10 Essential (primary) hypertension: Secondary | ICD-10-CM

## 2017-09-07 ENCOUNTER — Other Ambulatory Visit: Payer: Self-pay | Admitting: Family Medicine

## 2017-09-19 ENCOUNTER — Ambulatory Visit: Payer: Medicare Other | Admitting: Podiatry

## 2017-10-06 ENCOUNTER — Ambulatory Visit: Payer: Medicare Other | Admitting: Podiatry

## 2017-10-31 ENCOUNTER — Other Ambulatory Visit: Payer: Self-pay | Admitting: Family Medicine

## 2017-10-31 NOTE — Telephone Encounter (Signed)
Aricept request

## 2017-11-04 ENCOUNTER — Other Ambulatory Visit: Payer: Self-pay

## 2017-11-04 DIAGNOSIS — M10372 Gout due to renal impairment, left ankle and foot: Secondary | ICD-10-CM

## 2017-11-04 NOTE — Telephone Encounter (Signed)
Prior authorization for Uloric was denied. Insurance preference is allopurinol.

## 2017-11-05 NOTE — Telephone Encounter (Signed)
Cant use allopurinol as pt has kidney failure

## 2017-11-07 MED ORDER — FEBUXOSTAT 40 MG PO TABS: 40.0000 mg | ORAL_TABLET | Freq: Every day | ORAL | 1 refills | Status: DC

## 2017-11-07 NOTE — Telephone Encounter (Signed)
Copied from [place] [id number]. Topic: Quick Communication - Rx Refill/Question >> Nov 07, 2017 11:41 AM Robina Ade, Helene Kelp D wrote: Medication: calcitRIOL (ROCALTROL) 0.25 MCG capsule and ULORIC 40 MG tablet   Has the patient contacted their pharmacy? Yes   (Agent: If no, request that the patient contact the pharmacy for the refill.) Kimiku from [facility] called and said that patient needs these two medication by today. Her medication Uloric they would like to know if Dr. Jeananne Rama and change her medication for something else and if not if he can discontinue it. Please sent medication today to her pharmacy, thanks.   Preferred Pharmacy (with phone number or street name): [place], [place] - [address]: Please be advised that RX refills may take up to 3 business days. We ask that you follow-up with your pharmacy.

## 2017-11-07 NOTE — Telephone Encounter (Signed)
Appeal of denial of Uloric was initiated via fax to Part D Appeals and Grievance department at [phone]  P.A. [id number] ID [id number]

## 2017-11-09 NOTE — Telephone Encounter (Signed)
[facility] called to follow up - they are still awaiting the rx for calcitrol and the urloric.  I explained the uroloric is being appealed for the denial  They are asking about the calcitrol refill and why it has not been filled yet.  They need to either have the medication or have a dc order so that they may stay in compliance.  Request call back

## 2017-11-10 MED ORDER — CALCITRIOL 0.25 MCG PO CAPS: 0.2500 ug | ORAL_CAPSULE | Freq: Every day | ORAL | 3 refills | Status: DC

## 2017-11-10 NOTE — Addendum Note (Signed)
Addended by: Gerda Diss A on: 11/10/2017 07:57 AM   Modules accepted: Orders

## 2017-11-15 NOTE — Telephone Encounter (Signed)
Appeal for Uloric was approved. Authorization number [id number] valid from 11/04/17 - 10/17/18. Will fax authorization to pharmacy then scan.

## 2017-11-30 ENCOUNTER — Other Ambulatory Visit: Payer: Self-pay | Admitting: Family Medicine

## 2017-11-30 DIAGNOSIS — I10 Essential (primary) hypertension: Secondary | ICD-10-CM

## 2017-12-12 ENCOUNTER — Telehealth: Payer: Self-pay

## 2017-12-12 NOTE — Telephone Encounter (Signed)
Fax received stating P.A. For Vayacog denied. Please advise.

## 2017-12-12 NOTE — Telephone Encounter (Signed)
Will leave for Dr. Jeananne Rama when he returns. Not really another option similar to that.

## 2017-12-14 NOTE — Telephone Encounter (Signed)
Use OTC fish oip

## 2017-12-15 NOTE — Telephone Encounter (Signed)
Message relayed to patient. Verbalized understanding and denied questions.   

## 2017-12-29 ENCOUNTER — Other Ambulatory Visit: Payer: Self-pay | Admitting: Family Medicine

## 2017-12-29 DIAGNOSIS — I10 Essential (primary) hypertension: Secondary | ICD-10-CM

## 2017-12-29 NOTE — Telephone Encounter (Signed)
LOV 02-23-17 with Dr. Jeananne Rama / Refill request for Hydrochlorothiazide / Will refill medication and advise patient will need yearly appointment for further refills /

## 2018-01-25 ENCOUNTER — Telehealth: Payer: Self-pay | Admitting: Family Medicine

## 2018-01-25 NOTE — Telephone Encounter (Signed)
Copied from [place] [id number]. Topic: Medicare AWV >> Jan 25, 2018 11:57 AM Leo Rod wrote: Called to schedule AWV with Nurse Health Advisor. Last AWV on 2/13 /17 please schedule AWV with [facility] any date  Thank you! For questions please contact: Jill Alexanders [phone]  Skype [email or web address]

## 2018-02-08 ENCOUNTER — Ambulatory Visit (INDEPENDENT_AMBULATORY_CARE_PROVIDER_SITE_OTHER): Payer: Medicare Other

## 2018-02-08 VITALS — BP 149/82 | HR 78 | Temp 97.6°F | Resp 17 | Ht 62.5 in | Wt 173.7 lb

## 2018-02-08 DIAGNOSIS — Z0000 Encounter for general adult medical examination without abnormal findings: Secondary | ICD-10-CM

## 2018-02-08 NOTE — Progress Notes (Signed)
Subjective:   Katie Saunders is a 84 y.o. female who presents for Medicare Annual (Subsequent) preventive examination.  Review of Systems:   Cardiac Risk Factors include: hypertension;advanced age (>61men, >23 women);obesity (BMI >30kg/m2);dyslipidemia     Objective:     Vitals: BP (!) 149/82 (BP Location: Left Arm, Patient Position: Sitting)   Pulse 78   Temp 97.6 F (36.4 C) (Temporal)   Resp 17   Ht 5' 2.5" (1.588 m)   Wt 173 lb 11.2 oz (78.8 kg)   BMI 31.26 kg/m   Body mass index is 31.26 kg/m.  Advanced Directives 02/08/2018 09/15/2016 04/07/2016 04/05/2016 03/24/2016  Does Patient Have a Medical Advance Directive? Yes No Yes No No  Type of Paramedic of [place];Living will - [place] in Chart? Yes - Yes - -  Would patient like information on creating a medical advance directive? - - - No - patient declined information -    Tobacco Social History   Tobacco Use  Smoking Status Never Smoker  Smokeless Tobacco Never Used     Counseling given: Not Answered   Clinical Intake:  Pre-visit preparation completed: Yes  Pain : No/denies pain     Nutritional Status: BMI > 30  Obese Nutritional Risks: None Diabetes: No  How often do you need to have someone help you when you read instructions, pamphlets, or other written materials from your doctor or pharmacy?: 1 - Never What is the last grade level you completed in school?: masters degree   Interpreter Needed?: No  Information entered by :: Tiffany Hill,LPN   Past Medical History:  Diagnosis Date  . Anxiety   . Cancer [facility])    breast Left  . Cataract   . Dementia   . Depression   . Glaucoma   . Gout   . Hyperlipidemia   . Osteoporosis   . Postmastectomy lymphedema   . Renal insufficiency    Past Surgical History:  Procedure Laterality Date  . ABDOMINAL HYSTERECTOMY    . BREAST SURGERY    . EYE SURGERY    . KNEE  ARTHROPLASTY Left 04/05/2016   Procedure: COMPUTER ASSISTED TOTAL KNEE ARTHROPLASTY;  Surgeon: Dereck Leep, MD;  Location: [facility] ORS;  Service: Orthopedics;  Laterality: Left;  . Left knee arthroscopy  09/19/2009   Dr. Margaretmary Eddy   Family History  Problem Relation Age of Onset  . Cancer Father    Social History   Socioeconomic History  . Marital status: Widowed    Spouse name: Not on file  . Number of children: Not on file  . Years of education: Not on file  . Highest education level: Not on file  Occupational History  . Not on file  Social Needs  . Financial resource strain: Not hard at all  . Food insecurity:    Worry: Never true    Inability: Never true  . Transportation needs:    Medical: No    Non-medical: No  Tobacco Use  . Smoking status: Never Smoker  . Smokeless tobacco: Never Used  Substance and Sexual Activity  . Alcohol use: No  . Drug use: No  . Sexual activity: Not on file  Lifestyle  . Physical activity:    Days per week: 0 days    Minutes per session: 0 min  . Stress: Not at all  Relationships  . Social connections:    Talks on phone:  More than three times a week    Gets together: More than three times a week    Attends religious service: More than 4 times per year    Active member of club or organization: No    Attends meetings of clubs or organizations: Never    Relationship status: Widowed  Other Topics Concern  . Not on file  Social History Narrative  . Not on file    Outpatient Encounter Medications as of 02/08/2018  Medication Sig  . calcitRIOL (ROCALTROL) 0.25 MCG capsule Take 1 capsule (0.25 mcg total) by mouth daily.  . COMBIGAN 0.2-0.5 % ophthalmic solution Place 1 drop into both eyes every 12 (twelve) hours.   Marland Kitchen donepezil (ARICEPT) 5 MG tablet TAKE ONE TABLET BY MOUTH AT BEDTIME  . dorzolamide (TRUSOPT) 2 % ophthalmic solution Place 1 drop into the left eye 2 (two) times daily.   . febuxostat (ULORIC) 40 MG tablet Take 1 tablet (40  mg total) by mouth daily.  . hydrochlorothiazide (HYDRODIURIL) 25 MG tablet TAKE ONE TABLET EVERY DAY  . losartan (COZAAR) 100 MG tablet TAKE ONE TABLET BY MOUTH EVERY DAY  . LUMIGAN 0.01 % SOLN Place 1 drop into both eyes at bedtime.   . metoprolol tartrate (LOPRESSOR) 50 MG tablet TAKE ONE TABLET EVERY DAY  . VAYACOG 100-19.5-6.5 MG CAPS TAKE 1 CAPSULE BY MOUTH EVERY DAY  . diclofenac sodium (VOLTAREN) 1 % GEL APPLY 2 GRAMS 4 TIMES DAILY AS DIRECTED (Patient not taking: Reported on 02/08/2018)  . fluticasone (FLONASE) 50 MCG/ACT nasal spray Place 2 sprays into both nostrils daily. (Patient not taking: Reported on 02/23/2017)  . hydrocortisone (PROCTOCORT) 1 % CREA Apply topically.  . [DISCONTINUED] Diclofenac Sodium 1 % CREA Apply 2 g topically 4 (four) times daily.   No facility-administered encounter medications on file as of 02/08/2018.     Activities of Daily Living In your present state of health, do you have any difficulty performing the following activities: 02/08/2018  Hearing? Y  Vision? N  Difficulty concentrating or making decisions? Y  Walking or climbing stairs? Y  Dressing or bathing? N  Doing errands, shopping? Y  Comment daughter takes to appointments   Preparing Food and eating ? N  Using the Toilet? N  In the past six months, have you accidently leaked urine? N  Do you have problems with loss of bowel control? N  Managing your Medications? Y  Comment assisted living   Managing your Finances? Y  Comment assisted living   Housekeeping or managing your Housekeeping? Y  Comment assisted living   Some recent data might be hidden    Patient Care Team: Guadalupe Maple, MD as PCP - General (Family Medicine) Lollie Sails, MD as Consulting Physician (Gastroenterology) Bary Castilla, Forest Gleason, MD (General Surgery) Anthonette Legato, MD (Internal Medicine)    Assessment:   This is a routine wellness examination for Katie Saunders.  Exercise Activities and Dietary  recommendations Current Exercise Habits: Structured exercise class, Type of exercise: strength training/weights, Time (Minutes): 60, Frequency (Times/Week): 5, Weekly Exercise (Minutes/Week): 300, Intensity: Mild, Exercise limited by: None identified  Goals    . DIET - INCREASE WATER INTAKE     recommend drinking at least 6-8 glasses of water a day        Fall Risk Fall Risk  02/08/2018 02/23/2017 12/01/2015  Falls in the past year? No No No   Is the patient's home free of loose throw rugs in walkways, pet beds, electrical cords,  etc?   yes      Grab bars in the bathroom? yes      Handrails on the stairs?   Yes       Adequate lighting?   yes   Timed Get Up and Go performed: Completed in 8 seconds with no use of assistive devices, steady gait. No intervention needed at this time.   Depression Screen PHQ 2/9 Scores 02/08/2018 02/23/2017 12/01/2015  PHQ - 2 Score 0 0 0     Cognitive Function     6CIT Screen 02/08/2018  What Year? 4 points  What month? 3 points  What time? 0 points  Count back from 20 0 points  Months in reverse 0 points  Repeat phrase 10 points  Total Score 17    Immunization History  Administered Date(s) Administered  . Influenza,inj,Quad PF,6+ Mos 08/17/2017  . Influenza-Unspecified 07/22/2014, 07/10/2015, 08/17/2016  . Pneumococcal Conjugate-13 05/20/2014  . Pneumococcal-Unspecified 01/16/1997, 07/06/2002  . Td 11/27/2014  . Zoster 03/08/2012    Qualifies for Shingles Vaccine? Yes, discussed shingrix vaccine   Screening Tests Health Maintenance  Topic Date Due  . INFLUENZA VACCINE  05/18/2018  . TETANUS/TDAP  11/27/2024  . DEXA SCAN  Completed  . PNA vac Low Risk Adult  Completed  . COLONOSCOPY  Discontinued    Cancer Screenings: Lung: Low Dose CT Chest recommended if Age 86-80 years, 30 pack-year currently smoking OR have quit w/in 15years. Patient does not qualify. Breast:  Up to date on Mammogram? Yes  No longer required Up to date of Bone  Density/Dexa? Yes no longer required Colorectal: no longer required  Additional Screenings: : Hepatitis C Screening: not indicated      Plan:    I have personally reviewed and addressed the Medicare Annual Wellness questionnaire and have noted the following in the patient's chart:  A. Medical and social history B. Use of alcohol, tobacco or illicit drugs  C. Current medications and supplements D. Functional ability and status E.  Nutritional status F.  Physical activity G. Advance directives H. List of other physicians I.  Hospitalizations, surgeries, and ER visits in previous 12 months J.  [place] such as hearing and vision if needed, cognitive and depression L. Referrals and appointments   In addition, I have reviewed and discussed with patient certain preventive protocols, quality metrics, and best practice recommendations. A written personalized care plan for preventive services as well as general preventive health recommendations were provided to patient.   Signed,  Tyler Aas, LPN Nurse Health Advisor   Nurse Notes:none

## 2018-02-08 NOTE — Patient Instructions (Signed)
[name] , Thank you for taking time to come for your Medicare Wellness Visit. I appreciate your ongoing commitment to your health goals. Please review the following plan we discussed and let me know if I can assist you in the future.   Screening recommendations/referrals: Colonoscopy: no longer required Mammogram: no longer required Bone Density: no longer required Recommended yearly ophthalmology/optometry visit for glaucoma screening and checkup Recommended yearly dental visit for hygiene and checkup  Vaccinations: Influenza vaccine: up to date Pneumococcal vaccine: up to date Tdap vaccine: up to date Shingles vaccine: eligible for shingrix vaccine, check with your insurance company for coverage     Advanced directives: copy on file   Conditions/risks identified: recommend drinking at least 6-8 glasses of water a day   Next appointment: Follow up in one year for your annual wellness exam.    Preventive Care 65 Years and Older, Female Preventive care refers to lifestyle choices and visits with your health care provider that can promote health and wellness. What does preventive care include?  A yearly physical exam. This is also called an annual well check.  Dental exams once or twice a year.  Routine eye exams. Ask your health care provider how often you should have your eyes checked.  Personal lifestyle choices, including:  Daily care of your teeth and gums.  Regular physical activity.  Eating a healthy diet.  Avoiding tobacco and drug use.  Limiting alcohol use.  Practicing safe sex.  Taking low-dose aspirin every day.  Taking vitamin and mineral supplements as recommended by your health care provider. What happens during an annual well check? The services and screenings done by your health care provider during your annual well check will depend on your age, overall health, lifestyle risk factors, and family history of disease. Counseling  Your health care  provider may ask you questions about your:  Alcohol use.  Tobacco use.  Drug use.  Emotional well-being.  Home and relationship well-being.  Sexual activity.  Eating habits.  History of falls.  Memory and ability to understand (cognition).  Work and work Statistician.  Reproductive health. Screening  You may have the following tests or measurements:  Height, weight, and BMI.  Blood pressure.  Lipid and cholesterol levels. These may be checked every 5 years, or more frequently if you are over 2 years old.  Skin check.  Lung cancer screening. You may have this screening every year starting at age 82 if you have a 30-pack-year history of smoking and currently smoke or have quit within the past 15 years.  Fecal occult blood test (FOBT) of the stool. You may have this test every year starting at age 82.  Flexible sigmoidoscopy or colonoscopy. You may have a sigmoidoscopy every 5 years or a colonoscopy every 10 years starting at age 82.  Hepatitis C blood test.  Hepatitis B blood test.  Sexually transmitted disease (STD) testing.  Diabetes screening. This is done by checking your blood sugar (glucose) after you have not eaten for a while (fasting). You may have this done every 1-3 years.  Bone density scan. This is done to screen for osteoporosis. You may have this done starting at age 82.  Mammogram. This may be done every 1-2 years. Talk to your health care provider about how often you should have regular mammograms. Talk with your health care provider about your test results, treatment options, and if necessary, the need for more tests. Vaccines  Your health care provider may recommend certain  vaccines, such as:  Influenza vaccine. This is recommended every year.  Tetanus, diphtheria, and acellular pertussis (Tdap, Td) vaccine. You may need a Td booster every 10 years.  Zoster vaccine. You may need this after age 32.  Pneumococcal 13-valent conjugate (PCV13)  vaccine. One dose is recommended after age 82.  Pneumococcal polysaccharide (PPSV23) vaccine. One dose is recommended after age 82. Talk to your health care provider about which screenings and vaccines you need and how often you need them. This information is not intended to replace advice given to you by your health care provider. Make sure you discuss any questions you have with your health care provider. Document Released: 10/31/2015 Document Revised: 06/23/2016 Document Reviewed: 08/05/2015 Elsevier Interactive Patient Education  2017 [place] Prevention in the Home Falls can cause injuries. They can happen to people of all ages. There are many things you can do to make your home safe and to help prevent falls. What can I do on the outside of my home?  Regularly fix the edges of walkways and driveways and fix any cracks.  Remove anything that might make you trip as you walk through a door, such as a raised step or threshold.  Trim any bushes or trees on the path to your home.  Use bright outdoor lighting.  Clear any walking paths of anything that might make someone trip, such as rocks or tools.  Regularly check to see if handrails are loose or broken. Make sure that both sides of any steps have handrails.  Any raised decks and porches should have guardrails on the edges.  Have any leaves, snow, or ice cleared regularly.  Use sand or salt on walking paths during winter.  Clean up any spills in your garage right away. This includes oil or grease spills. What can I do in the bathroom?  Use night lights.  Install grab bars by the toilet and in the tub and shower. Do not use towel bars as grab bars.  Use non-skid mats or decals in the tub or shower.  If you need to sit down in the shower, use a plastic, non-slip stool.  Keep the floor dry. Clean up any water that spills on the floor as soon as it happens.  Remove soap buildup in the tub or shower  regularly.  Attach bath mats securely with double-sided non-slip rug tape.  Do not have throw rugs and other things on the floor that can make you trip. What can I do in the bedroom?  Use night lights.  Make sure that you have a light by your bed that is easy to reach.  Do not use any sheets or blankets that are too big for your bed. They should not hang down onto the floor.  Have a firm chair that has side arms. You can use this for support while you get dressed.  Do not have throw rugs and other things on the floor that can make you trip. What can I do in the kitchen?  Clean up any spills right away.  Avoid walking on wet floors.  Keep items that you use a lot in easy-to-reach places.  If you need to reach something above you, use a strong step stool that has a grab bar.  Keep electrical cords out of the way.  Do not use floor polish or wax that makes floors slippery. If you must use wax, use non-skid floor wax.  Do not have throw rugs and other things  on the floor that can make you trip. What can I do with my stairs?  Do not leave any items on the stairs.  Make sure that there are handrails on both sides of the stairs and use them. Fix handrails that are broken or loose. Make sure that handrails are as long as the stairways.  Check any carpeting to make sure that it is firmly attached to the stairs. Fix any carpet that is loose or worn.  Avoid having throw rugs at the top or bottom of the stairs. If you do have throw rugs, attach them to the floor with carpet tape.  Make sure that you have a light switch at the top of the stairs and the bottom of the stairs. If you do not have them, ask someone to add them for you. What else can I do to help prevent falls?  Wear shoes that:  Do not have high heels.  Have rubber bottoms.  Are comfortable and fit you well.  Are closed at the toe. Do not wear sandals.  If you use a stepladder:  Make sure that it is fully  opened. Do not climb a closed stepladder.  Make sure that both sides of the stepladder are locked into place.  Ask someone to hold it for you, if possible.  Clearly mark and make sure that you can see:  Any grab bars or handrails.  First and last steps.  Where the edge of each step is.  Use tools that help you move around (mobility aids) if they are needed. These include:  Canes.  Walkers.  Scooters.  Crutches.  Turn on the lights when you go into a dark area. Replace any light bulbs as soon as they burn out.  Set up your furniture so you have a clear path. Avoid moving your furniture around.  If any of your floors are uneven, fix them.  If there are any pets around you, be aware of where they are.  Review your medicines with your doctor. Some medicines can make you feel dizzy. This can increase your chance of falling. Ask your doctor what other things that you can do to help prevent falls. This information is not intended to replace advice given to you by your health care provider. Make sure you discuss any questions you have with your health care provider. Document Released: 07/31/2009 Document Revised: 03/11/2016 Document Reviewed: 11/08/2014 Elsevier Interactive Patient Education  2017 [employer].

## 2018-02-23 ENCOUNTER — Other Ambulatory Visit: Payer: Self-pay | Admitting: Family Medicine

## 2018-03-07 ENCOUNTER — Other Ambulatory Visit: Payer: Self-pay | Admitting: Family Medicine

## 2018-03-07 DIAGNOSIS — I10 Essential (primary) hypertension: Secondary | ICD-10-CM

## 2018-03-08 NOTE — Telephone Encounter (Signed)
Losartan refill Last OV: 02/23/17 Last Refill:11/30/17 #90 tab No RF Pharmacy:[facility] Dr Jeananne Rama

## 2018-03-22 ENCOUNTER — Telehealth: Payer: Self-pay | Admitting: Family Medicine

## 2018-03-22 NOTE — Telephone Encounter (Signed)
Stop taking it is not doing any good at this point.

## 2018-03-22 NOTE — Telephone Encounter (Signed)
Copied from [place] [id number]. Topic: Quick Communication - See Telephone Encounter >> Mar 22, 2018  2:51 PM Conception Chancy, NT wrote: CRM for notification. See Telephone encounter for: 03/22/18.  [place] is calling and states the pharmacy can not get VAYACOG 100-19.5-6.5 MG CAPS. She would like to know can something else be called in or does she just need to stop taking it.  Cb# [phone]

## 2018-03-23 NOTE — Telephone Encounter (Signed)
Notified. 

## 2018-03-29 NOTE — Telephone Encounter (Signed)
Katie Saunders says they need a written order saying to discontinue. Fax:[phone] She just faxed as well.

## 2018-03-30 NOTE — Telephone Encounter (Signed)
Order signed by Dr. Jeananne Rama and faxed back.

## 2018-04-05 ENCOUNTER — Other Ambulatory Visit: Payer: Self-pay | Admitting: Family Medicine

## 2018-04-05 DIAGNOSIS — I10 Essential (primary) hypertension: Secondary | ICD-10-CM

## 2018-04-07 NOTE — Telephone Encounter (Signed)
I Interface request for hydrochlorothiazide 25MG   Lov  5/918 with Dr.  Jeananne Rama   Last refill:  12/29/17 #90 0 refill.     [facility]acy: [facility]  [facility]

## 2018-06-07 ENCOUNTER — Other Ambulatory Visit: Payer: Self-pay | Admitting: Family Medicine

## 2018-06-07 DIAGNOSIS — I10 Essential (primary) hypertension: Secondary | ICD-10-CM

## 2018-06-07 NOTE — Telephone Encounter (Signed)
Losartan 100 mg refill Last Refill:03/08/18 # 90 Last OV: 02/23/17 with [place]       Needs appt PCP: [place], [place]

## 2018-06-15 ENCOUNTER — Encounter: Payer: Self-pay | Admitting: Family Medicine

## 2018-06-15 ENCOUNTER — Ambulatory Visit: Payer: Medicare Other | Admitting: Family Medicine

## 2018-06-15 DIAGNOSIS — Z0000 Encounter for general adult medical examination without abnormal findings: Secondary | ICD-10-CM | POA: Diagnosis not present

## 2018-06-15 DIAGNOSIS — E211 Secondary hyperparathyroidism, not elsewhere classified: Secondary | ICD-10-CM | POA: Insufficient documentation

## 2018-06-15 DIAGNOSIS — I129 Hypertensive chronic kidney disease with stage 1 through stage 4 chronic kidney disease, or unspecified chronic kidney disease: Secondary | ICD-10-CM

## 2018-06-15 DIAGNOSIS — I890 Lymphedema, not elsewhere classified: Secondary | ICD-10-CM

## 2018-06-15 DIAGNOSIS — M10372 Gout due to renal impairment, left ankle and foot: Secondary | ICD-10-CM

## 2018-06-15 DIAGNOSIS — Z7189 Other specified counseling: Secondary | ICD-10-CM | POA: Diagnosis not present

## 2018-06-15 DIAGNOSIS — I10 Essential (primary) hypertension: Secondary | ICD-10-CM

## 2018-06-15 DIAGNOSIS — G301 Alzheimer's disease with late onset: Secondary | ICD-10-CM | POA: Diagnosis not present

## 2018-06-15 DIAGNOSIS — N184 Chronic kidney disease, stage 4 (severe): Secondary | ICD-10-CM

## 2018-06-15 DIAGNOSIS — F0280 Dementia in other diseases classified elsewhere without behavioral disturbance: Secondary | ICD-10-CM

## 2018-06-15 LAB — MICROSCOPIC EXAMINATION

## 2018-06-15 LAB — URINALYSIS, ROUTINE W REFLEX MICROSCOPIC
BILIRUBIN UA: NEGATIVE
GLUCOSE, UA: NEGATIVE
KETONES UA: NEGATIVE
NITRITE UA: NEGATIVE
Protein, UA: NEGATIVE
RBC, UA: NEGATIVE
SPEC GRAV UA: 1.015 (ref 1.005–1.030)
UUROB: 0.2 mg/dL (ref 0.2–1.0)
pH, UA: 5.5 (ref 5.0–7.5)

## 2018-06-15 MED ORDER — HYDROCHLOROTHIAZIDE 25 MG PO TABS: 25.0000 mg | ORAL_TABLET | Freq: Every day | ORAL | 4 refills | Status: DC

## 2018-06-15 MED ORDER — LOSARTAN POTASSIUM 100 MG PO TABS: 100.0000 mg | ORAL_TABLET | Freq: Every day | ORAL | 4 refills | Status: DC

## 2018-06-15 MED ORDER — DONEPEZIL HCL 5 MG PO TABS: 5.0000 mg | ORAL_TABLET | Freq: Every day | ORAL | 4 refills | Status: DC

## 2018-06-15 MED ORDER — METOPROLOL TARTRATE 50 MG PO TABS: 50.0000 mg | ORAL_TABLET | Freq: Every day | ORAL | 4 refills | Status: DC

## 2018-06-15 MED ORDER — FEBUXOSTAT 40 MG PO TABS: 40.0000 mg | ORAL_TABLET | Freq: Every day | ORAL | 4 refills | Status: DC

## 2018-06-15 MED ORDER — CALCITRIOL 0.25 MCG PO CAPS: 0.2500 ug | ORAL_CAPSULE | Freq: Every day | ORAL | 4 refills | Status: DC

## 2018-06-15 NOTE — Assessment & Plan Note (Signed)
The current medical regimen is effective;  continue present plan and medications.  

## 2018-06-15 NOTE — Assessment & Plan Note (Signed)
No gout

## 2018-06-15 NOTE — Assessment & Plan Note (Signed)
Wears compression

## 2018-06-15 NOTE — Assessment & Plan Note (Signed)
See nephrology notes

## 2018-06-15 NOTE — Assessment & Plan Note (Signed)
Stable MMSE 21

## 2018-06-15 NOTE — Assessment & Plan Note (Signed)
A voluntary discussion about advanced care planning including explanation and discussion of advanced directives was extentively discussed with the patient.  Explained about the healthcare proxy and living will was reviewed and packet with forms with expiration of how to fill them out was given.  Time spent: Encounter 16+ min individuals present: Patient and daughter.

## 2018-06-15 NOTE — Progress Notes (Signed)
BP 138/71 (BP Location: Left Arm, Patient Position: Sitting, Cuff Size: Normal)   Pulse 68   Temp 98.2 F (36.8 C) (Oral)   Ht 5' 3.25" (1.607 m)   Wt 173 lb (78.5 kg)   SpO2 97%   BMI 30.40 kg/m    Subjective:    Patient ID: Katie Saunders, female    DOB: 04-20-1931, 84 y.o.   MRN: [id number]  HPI: Katie Saunders is a 84 y.o. female  Annual exam Patient accompanied by daughter who assists with history all in all doing well stays [facility] the home place and does well.  Taking medication as listed on medicine sheet without problems or issues.  Good control of blood pressure glaucoma nerves gout  Relevant past medical, surgical, family and social history reviewed and updated as indicated. Interim medical history since our last visit reviewed. Allergies and medications reviewed and updated.  Review of Systems  Constitutional: Negative.   HENT: Negative.   Eyes: Negative.   Respiratory: Negative.   Cardiovascular: Negative.   Gastrointestinal: Negative.   Endocrine: Negative.   Genitourinary: Negative.   Musculoskeletal: Negative.   Skin: Negative.   Allergic/Immunologic: Negative.   Neurological: Negative.   Hematological: Negative.   Psychiatric/Behavioral: Negative.     Per HPI unless specifically indicated above     Objective:    BP 138/71 (BP Location: Left Arm, Patient Position: Sitting, Cuff Size: Normal)   Pulse 68   Temp 98.2 F (36.8 C) (Oral)   Ht 5' 3.25" (1.607 m)   Wt 173 lb (78.5 kg)   SpO2 97%   BMI 30.40 kg/m   Wt Readings from Last 3 Encounters:  06/15/18 173 lb (78.5 kg)  02/08/18 173 lb 11.2 oz (78.8 kg)  02/23/17 169 lb (76.7 kg)    Physical Exam  Constitutional: She is oriented to person, place, and time. She appears well-developed and well-nourished.  HENT:  Head: Normocephalic and atraumatic.  Right Ear: External ear normal.  Left Ear: External ear normal.  Nose: Nose normal.  Mouth/Throat: Oropharynx is clear and moist.  Eyes:  Pupils are equal, round, and reactive to light. Conjunctivae and EOM are normal.  Neck: Normal range of motion. Neck supple. Carotid bruit is not present.  Cardiovascular: Normal rate, regular rhythm and normal heart sounds.  No murmur heard. Pulmonary/Chest: Effort normal and breath sounds normal. She exhibits no mass. Right breast exhibits no mass, no skin change and no tenderness. Left breast exhibits no mass, no skin change and no tenderness. Breasts are symmetrical.  Abdominal: Soft. Bowel sounds are normal. There is no hepatosplenomegaly.  Musculoskeletal: Normal range of motion.  Neurological: She is alert and oriented to person, place, and time.  Skin: No rash noted.  Psychiatric: She has a normal mood and affect. Her behavior is normal. Judgment and thought content normal.    Results for orders placed or performed in visit on 02/23/17  Microscopic Examination  Result Value Ref Range   WBC, UA 0-5 0 - 5 /hpf   RBC, UA None seen 0 - 2 /hpf   Epithelial Cells (non renal) 0-10 0 - 10 /hpf   Bacteria, UA None seen None seen/Few  CBC with Differential/Platelet  Result Value Ref Range   WBC 5.4 3.4 - 10.8 x10E3/uL   RBC 3.92 3.77 - 5.28 x10E6/uL   Hemoglobin 11.3 11.1 - 15.9 g/dL   Hematocrit 34.6 34.0 - 46.6 %   MCV 88 79 - 97 fL   MCH 28.8  26.6 - 33.0 pg   MCHC 32.7 31.5 - 35.7 g/dL   RDW 14.4 12.3 - 15.4 %   Platelets 235 150 - 379 x10E3/uL   Neutrophils 60 Not Estab. %   Lymphs 31 Not Estab. %   Monocytes 6 Not Estab. %   Eos 2 Not Estab. %   Basos 1 Not Estab. %   Neutrophils Absolute 3.3 1.4 - 7.0 x10E3/uL   Lymphocytes Absolute 1.7 0.7 - 3.1 x10E3/uL   Monocytes Absolute 0.3 0.1 - 0.9 x10E3/uL   EOS (ABSOLUTE) 0.1 0.0 - 0.4 x10E3/uL   Basophils Absolute 0.0 0.0 - 0.2 x10E3/uL   Immature Granulocytes 0 Not Estab. %   Immature Grans (Abs) 0.0 0.0 - 0.1 x10E3/uL  Comprehensive metabolic panel  Result Value Ref Range   Glucose 80 65 - 99 mg/dL   BUN 35 (H) 8 - 27  mg/dL   Creatinine, Ser 1.76 (H) 0.57 - 1.00 mg/dL   GFR calc non Af Amer 26 (L) >59 mL/min/1.73   GFR calc Af Amer 30 (L) >59 mL/min/1.73   BUN/Creatinine Ratio 20 12 - 28   Sodium 144 134 - 144 mmol/L   Potassium 4.4 3.5 - 5.2 mmol/L   Chloride 105 96 - 106 mmol/L   CO2 24 18 - 29 mmol/L   Calcium 9.4 8.7 - 10.3 mg/dL   Total Protein 6.3 6.0 - 8.5 g/dL   Albumin 3.9 3.5 - 4.7 g/dL   Globulin, Total 2.4 1.5 - 4.5 g/dL   Albumin/Globulin Ratio 1.6 1.2 - 2.2   Bilirubin Total 0.4 0.0 - 1.2 mg/dL   Alkaline Phosphatase 83 39 - 117 IU/L   AST 13 0 - 40 IU/L   ALT 9 0 - 32 IU/L  Lipid panel  Result Value Ref Range   Cholesterol, Total 206 (H) 100 - 199 mg/dL   Triglycerides 117 0 - 149 mg/dL   HDL 53 >39 mg/dL   VLDL Cholesterol Cal 23 5 - 40 mg/dL   LDL Calculated 130 (H) 0 - 99 mg/dL   Chol/HDL Ratio 3.9 0.0 - 4.4 ratio  TSH  Result Value Ref Range   TSH 2.830 0.450 - 4.500 uIU/mL  Urinalysis, Routine w reflex microscopic  Result Value Ref Range   Specific Gravity, UA 1.020 1.005 - 1.030   pH, UA 5.5 5.0 - 7.5   Color, UA Yellow Yellow   Appearance Ur Clear Clear   Leukocytes, UA 1+ (A) Negative   Protein, UA Negative Negative/Trace   Glucose, UA Negative Negative   Ketones, UA Negative Negative   RBC, UA Trace (A) Negative   Bilirubin, UA Negative Negative   Urobilinogen, Ur 0.2 0.2 - 1.0 mg/dL   Nitrite, UA Negative Negative   Microscopic Examination See below:   Uric acid  Result Value Ref Range   Uric Acid 4.1 2.5 - 7.1 mg/dL      Assessment & Plan:   Problem List Items Addressed This Visit      Cardiovascular and Mediastinum   Hypertension    The current medical regimen is effective;  continue present plan and medications.       Relevant Medications   metoprolol tartrate (LOPRESSOR) 50 MG tablet   losartan (COZAAR) 100 MG tablet   hydrochlorothiazide (HYDRODIURIL) 25 MG tablet     Endocrine   Secondary hyperparathyroidism, non-renal [facility] 2)    See  nephrology notes      Relevant Orders   Comprehensive metabolic panel   CBC with Differential/Platelet  TSH     Nervous and Auditory   Dementia    Stable MMSE 21      Relevant Medications   donepezil (ARICEPT) 5 MG tablet     Genitourinary   CKD stage 4 secondary to hypertension ([place])    The current medical regimen is effective;  continue present plan and medications.       Relevant Orders   Comprehensive metabolic panel   CBC with Differential/Platelet   TSH   Urinalysis, Routine w reflex microscopic   Urine Culture     Other   Gout    No gout       Relevant Medications   febuxostat (ULORIC) 40 MG tablet   Other Relevant Orders   Uric acid   Advanced care planning/counseling discussion    A voluntary discussion about advanced care planning including explanation and discussion of advanced directives was extentively discussed with the patient.  Explained about the healthcare proxy and living will was reviewed and packet with forms with expiration of how to fill them out was given.  Time spent: Encounter 16+ min individuals present: Patient and daughter.      Lymphedema    Wears compression       Other Visit Diagnoses    Essential hypertension, benign       Relevant Medications   metoprolol tartrate (LOPRESSOR) 50 MG tablet   losartan (COZAAR) 100 MG tablet   hydrochlorothiazide (HYDRODIURIL) 25 MG tablet       Follow up plan: Return in about 1 year (around 06/16/2019) for Physical Exam.

## 2018-06-16 LAB — COMPREHENSIVE METABOLIC PANEL
ALBUMIN: 4.2 g/dL (ref 3.5–4.7)
ALT: 10 IU/L (ref 0–32)
AST: 12 IU/L (ref 0–40)
Albumin/Globulin Ratio: 1.8 (ref 1.2–2.2)
Alkaline Phosphatase: 86 IU/L (ref 39–117)
BUN / CREAT RATIO: 19 (ref 12–28)
BUN: 32 mg/dL — ABNORMAL HIGH (ref 8–27)
Bilirubin Total: 0.3 mg/dL (ref 0.0–1.2)
CALCIUM: 9.7 mg/dL (ref 8.7–10.3)
CO2: 23 mmol/L (ref 20–29)
CREATININE: 1.72 mg/dL — AB (ref 0.57–1.00)
Chloride: 106 mmol/L (ref 96–106)
GFR calc non Af Amer: 27 mL/min/{1.73_m2} — ABNORMAL LOW (ref >59)
GFR, EST AFRICAN AMERICAN: 31 mL/min/{1.73_m2} — AB (ref >59)
GLOBULIN, TOTAL: 2.4 g/dL (ref 1.5–4.5)
Glucose: 99 mg/dL (ref 65–99)
Potassium: 4.5 mmol/L (ref 3.5–5.2)
SODIUM: 142 mmol/L (ref 134–144)
TOTAL PROTEIN: 6.6 g/dL (ref 6.0–8.5)

## 2018-06-16 LAB — TSH: TSH: 2.37 u[IU]/mL (ref 0.450–4.500)

## 2018-06-16 LAB — CBC WITH DIFFERENTIAL/PLATELET
BASOS: 1 %
Basophils Absolute: 0 10*3/uL (ref 0.0–0.2)
EOS (ABSOLUTE): 0.2 10*3/uL (ref 0.0–0.4)
EOS: 3 %
HEMATOCRIT: 33.2 % — AB (ref 34.0–46.6)
Hemoglobin: 11.1 g/dL (ref 11.1–15.9)
IMMATURE GRANULOCYTES: 0 %
Immature Grans (Abs): 0 10*3/uL (ref 0.0–0.1)
Lymphocytes Absolute: 1.7 10*3/uL (ref 0.7–3.1)
Lymphs: 31 %
MCH: 30 pg (ref 26.6–33.0)
MCHC: 33.4 g/dL (ref 31.5–35.7)
MCV: 90 fL (ref 79–97)
MONOS ABS: 0.5 10*3/uL (ref 0.1–0.9)
Monocytes: 8 %
NEUTROS PCT: 57 %
Neutrophils Absolute: 3.1 10*3/uL (ref 1.4–7.0)
Platelets: 261 10*3/uL (ref 150–450)
RBC: 3.7 x10E6/uL — ABNORMAL LOW (ref 3.77–5.28)
RDW: 13.1 % (ref 12.3–15.4)
WBC: 5.5 10*3/uL (ref 3.4–10.8)

## 2018-06-16 LAB — URIC ACID: Uric Acid: 4.4 mg/dL (ref 2.5–7.1)

## 2018-06-19 LAB — URINE CULTURE

## 2018-06-21 ENCOUNTER — Telehealth: Payer: Self-pay | Admitting: Family Medicine

## 2018-06-21 MED ORDER — SULFAMETHOXAZOLE-TRIMETHOPRIM 800-160 MG PO TABS: 1.0000 | ORAL_TABLET | Freq: Two times a day (BID) | ORAL | 0 refills | Status: DC

## 2018-06-21 NOTE — Telephone Encounter (Signed)
Phone call Discussed with patient's daughter patient still having UTI symptoms patient's urine specimen came back equivocal will go ahead and treat with Septra

## 2018-06-21 NOTE — Telephone Encounter (Signed)
-----   Message from Amada Kingfisher, [place] sent at 06/21/2018 11:56 AM EDT ----- Patient was transferred to provider for telephone conversation.

## 2018-11-17 ENCOUNTER — Ambulatory Visit: Payer: Self-pay | Admitting: *Deleted

## 2018-11-17 DIAGNOSIS — R05 Cough: Secondary | ICD-10-CM

## 2018-11-17 DIAGNOSIS — R059 Cough, unspecified: Secondary | ICD-10-CM

## 2018-11-17 NOTE — Telephone Encounter (Signed)
  Answer Assessment - Initial Assessment Questions 1. REASON FOR CALL or QUESTION: "What is your reason for calling today?" or "How can I best help you?" or "What question do you have that I can help answer?"     Asking for order to obtain a chest xray 2. CALLER: Document the source of call. (e.g., laboratory, patient).     Pt's daughter  Protocols used: PCP CALL - NO TRIAGE-A-AH

## 2018-11-17 NOTE — Telephone Encounter (Signed)
Spoke with pt's daughter, [name] who was with the pt at [facility] who states that the pt seems to have some worsening wheezing. Pt was seen by renal doctor on yesterday in which wheezing and coughing was also noted and pt was placed on Mucinex. Daughter feels that she has worsening wheezing today. Spoke with March Rummage, nurse at [facility] assisted living who states that the pt does have some wheezing. Marissa, nurse at [facility] is asking if order for chest xray could be obtained. Order would need to be called in to: [facility]- [phone] For additional questions Mable Fill can be contacted at [phone] until 5:30 pm and after that time Estill Bamberg would be the nurse to contact.

## 2018-11-19 NOTE — Telephone Encounter (Signed)
OK 

## 2018-11-20 NOTE — Telephone Encounter (Signed)
Order was called in to [facility]

## 2018-11-20 NOTE — Telephone Encounter (Signed)
[facility] notified.

## 2018-11-20 NOTE — Telephone Encounter (Signed)
ok 

## 2018-11-20 NOTE — Telephone Encounter (Signed)
Katie Saunders received call for chest xray but is needing an order and demos faxed over to fax # [phone]. They cannot complete chest xray from verbal.

## 2018-11-20 NOTE — Addendum Note (Signed)
Addended by: Golden Pop A on: 11/20/2018 02:34 PM   Modules accepted: Orders

## 2018-11-20 NOTE — Telephone Encounter (Signed)
done

## 2018-11-20 NOTE — Telephone Encounter (Signed)
Printed and faxed

## 2018-11-20 NOTE — Telephone Encounter (Signed)
Please place order in Epic

## 2018-12-28 ENCOUNTER — Ambulatory Visit: Payer: Medicare Other | Admitting: Family Medicine

## 2018-12-28 ENCOUNTER — Other Ambulatory Visit: Payer: Self-pay

## 2018-12-28 ENCOUNTER — Encounter: Payer: Self-pay | Admitting: Family Medicine

## 2018-12-28 DIAGNOSIS — M1612 Unilateral primary osteoarthritis, left hip: Secondary | ICD-10-CM

## 2018-12-28 DIAGNOSIS — F0280 Dementia in other diseases classified elsewhere without behavioral disturbance: Secondary | ICD-10-CM

## 2018-12-28 DIAGNOSIS — G301 Alzheimer's disease with late onset: Secondary | ICD-10-CM

## 2018-12-28 DIAGNOSIS — M169 Osteoarthritis of hip, unspecified: Secondary | ICD-10-CM | POA: Insufficient documentation

## 2018-12-28 MED ORDER — MELOXICAM 7.5 MG PO TABS: 7.5000 mg | ORAL_TABLET | Freq: Every day | ORAL | 2 refills | Status: DC

## 2018-12-28 NOTE — Assessment & Plan Note (Signed)
Getting worse reviewed supportive care will continue times a pill.

## 2018-12-28 NOTE — Progress Notes (Signed)
There were no vitals taken for this visit.   Subjective:    Patient ID: Katie Saunders, female    DOB: 12-07-1930, 85 y.o.   MRN: [id number]  HPI: Katie Saunders is a 85 y.o. female  Patient unable to provide any history caregiver assist with history patient complaining of left lower back hip pain is been limiting her limited walking due to pain and discomfort.  When standing patient groans from discomfort in her left hip area. No blood in her stool or urine no other specific complaints. Patient with significant Alzheimer's changes with repetitive speech and questions and unable to provide history.  Relevant past medical, surgical, family and social history reviewed and updated as indicated. Interim medical history since our last visit reviewed. Allergies and medications reviewed and updated.  Review of Systems  Constitutional: Negative.   Respiratory: Negative.   Cardiovascular: Negative.     Per HPI unless specifically indicated above     Objective:    There were no vitals taken for this visit.  Wt Readings from Last 3 Encounters:  06/15/18 173 lb (78.5 kg)  02/08/18 173 lb 11.2 oz (78.8 kg)  02/23/17 169 lb (76.7 kg)    Physical Exam Constitutional:      Appearance: She is well-developed.  HENT:     Head: Normocephalic and atraumatic.  Eyes:     Conjunctiva/sclera: Conjunctivae normal.  Neck:     Musculoskeletal: Normal range of motion.  Cardiovascular:     Rate and Rhythm: Normal rate and regular rhythm.     Heart sounds: Normal heart sounds.  Pulmonary:     Effort: Pulmonary effort is normal.     Breath sounds: Normal breath sounds.  Musculoskeletal: Normal range of motion.     Comments: Left hip with some tenderness in this area with range of motion  Skin:    Findings: No erythema.  Neurological:     Mental Status: She is alert and oriented to person, place, and time.  Psychiatric:        Behavior: Behavior normal.        Thought Content: Thought  content normal.        Judgment: Judgment normal.     Results for orders placed or performed in visit on 06/15/18  Urine Culture  Result Value Ref Range   Urine Culture, Routine Final report (A)    Organism ID, Bacteria Klebsiella oxytoca (A)    Antimicrobial Susceptibility Comment   Microscopic Examination  Result Value Ref Range   WBC, UA 6-10 (A) 0 - 5 /hpf   RBC, UA 0-2 0 - 2 /hpf   Epithelial Cells (non renal) 0-10 0 - 10 /hpf   Renal Epithel, UA 0-10 (A) None seen /hpf   Bacteria, UA Few None seen/Few   Yeast, UA Present None seen  Comprehensive metabolic panel  Result Value Ref Range   Glucose 99 65 - 99 mg/dL   BUN 32 (H) 8 - 27 mg/dL   Creatinine, Ser 1.72 (H) 0.57 - 1.00 mg/dL   GFR calc non Af Amer 27 (L) >59 mL/min/1.73   GFR calc Af Amer 31 (L) >59 mL/min/1.73   BUN/Creatinine Ratio 19 12 - 28   Sodium 142 134 - 144 mmol/L   Potassium 4.5 3.5 - 5.2 mmol/L   Chloride 106 96 - 106 mmol/L   CO2 23 20 - 29 mmol/L   Calcium 9.7 8.7 - 10.3 mg/dL   Total Protein 6.6 6.0 - 8.5 g/dL  Albumin 4.2 3.5 - 4.7 g/dL   Globulin, Total 2.4 1.5 - 4.5 g/dL   Albumin/Globulin Ratio 1.8 1.2 - 2.2   Bilirubin Total 0.3 0.0 - 1.2 mg/dL   Alkaline Phosphatase 86 39 - 117 IU/L   AST 12 0 - 40 IU/L   ALT 10 0 - 32 IU/L  CBC with Differential/Platelet  Result Value Ref Range   WBC 5.5 3.4 - 10.8 x10E3/uL   RBC 3.70 (L) 3.77 - 5.28 x10E6/uL   Hemoglobin 11.1 11.1 - 15.9 g/dL   Hematocrit 33.2 (L) 34.0 - 46.6 %   MCV 90 79 - 97 fL   MCH 30.0 26.6 - 33.0 pg   MCHC 33.4 31.5 - 35.7 g/dL   RDW 13.1 12.3 - 15.4 %   Platelets 261 150 - 450 x10E3/uL   Neutrophils 57 Not Estab. %   Lymphs 31 Not Estab. %   Monocytes 8 Not Estab. %   Eos 3 Not Estab. %   Basos 1 Not Estab. %   Neutrophils Absolute 3.1 1.4 - 7.0 x10E3/uL   Lymphocytes Absolute 1.7 0.7 - 3.1 x10E3/uL   Monocytes Absolute 0.5 0.1 - 0.9 x10E3/uL   EOS (ABSOLUTE) 0.2 0.0 - 0.4 x10E3/uL   Basophils Absolute 0.0 0.0 -  0.2 x10E3/uL   Immature Granulocytes 0 Not Estab. %   Immature Grans (Abs) 0.0 0.0 - 0.1 x10E3/uL  TSH  Result Value Ref Range   TSH 2.370 0.450 - 4.500 uIU/mL  Uric acid  Result Value Ref Range   Uric Acid 4.4 2.5 - 7.1 mg/dL  Urinalysis, Routine w reflex microscopic  Result Value Ref Range   Specific Gravity, UA 1.015 1.005 - 1.030   pH, UA 5.5 5.0 - 7.5   Color, UA Yellow Yellow   Appearance Ur Hazy (A) Clear   Leukocytes, UA 2+ (A) Negative   Protein, UA Negative Negative/Trace   Glucose, UA Negative Negative   Ketones, UA Negative Negative   RBC, UA Negative Negative   Bilirubin, UA Negative Negative   Urobilinogen, Ur 0.2 0.2 - 1.0 mg/dL   Nitrite, UA Negative Negative   Microscopic Examination See below:       Assessment & Plan:   Problem List Items Addressed This Visit      Nervous and Auditory   Dementia ([place])    Getting worse reviewed supportive care will continue times a pill.        Musculoskeletal and Integument   Osteoarthritis of hip    Patient with most likely osteoarthritis changes of her hip resulting in pain.  Patient was initially given some meloxicam as the chart system was down at the time now that the chart system is back in place on chart review the patient has CKD and meloxicam is an appropriate medicine. I called the pharmacy and canceled the prescription and recommended Tylenol for hip pain.          Follow up plan: Return if symptoms worsen or fail to improve, for As scheduled.

## 2018-12-28 NOTE — Assessment & Plan Note (Signed)
Patient with most likely osteoarthritis changes of her hip resulting in pain.  Patient was initially given some meloxicam as the chart system was down at the time now that the chart system is back in place on chart review the patient has CKD and meloxicam is an appropriate medicine. I called the pharmacy and canceled the prescription and recommended Tylenol for hip pain.

## 2019-02-07 ENCOUNTER — Other Ambulatory Visit: Payer: Self-pay | Admitting: Family Medicine

## 2019-02-07 MED ORDER — TIOTROPIUM BROMIDE MONOHYDRATE 18 MCG IN CAPS: 18.0000 ug | ORAL_CAPSULE | Freq: Every day | RESPIRATORY_TRACT | 12 refills | Status: DC

## 2019-03-29 ENCOUNTER — Other Ambulatory Visit: Payer: Self-pay | Admitting: Family Medicine

## 2019-03-29 DIAGNOSIS — I10 Essential (primary) hypertension: Secondary | ICD-10-CM

## 2019-04-09 ENCOUNTER — Telehealth: Payer: Self-pay | Admitting: Family Medicine

## 2019-04-09 DIAGNOSIS — R300 Dysuria: Secondary | ICD-10-CM

## 2019-04-09 DIAGNOSIS — D649 Anemia, unspecified: Secondary | ICD-10-CM

## 2019-04-09 NOTE — Telephone Encounter (Signed)
Copied from Rafael Capo [phone]. Topic: General - Other >> Apr 09, 2019  9:37 AM Carolyn Stare wrote: Pt daughter [name]  call to ask if Dr Jeananne Rama will order pt a B12 said pt is unhappy and calls her crying, lonely want go out of her room .

## 2019-04-09 NOTE — Telephone Encounter (Signed)
Urinalysis ordered for dysuria

## 2019-04-09 NOTE — Telephone Encounter (Signed)
Copied from [place] [id number]. Topic: General - Other >> Apr 09, 2019 11:57 AM Parke Poisson wrote: Reason for CRM: Horris Latino from [facility] called wanting to set up appointment for lab work to get kidney function assessed.Call back # [phone]

## 2019-04-10 ENCOUNTER — Other Ambulatory Visit: Payer: Medicare Other

## 2019-04-10 ENCOUNTER — Other Ambulatory Visit: Payer: Self-pay

## 2019-04-10 DIAGNOSIS — R300 Dysuria: Secondary | ICD-10-CM

## 2019-04-10 DIAGNOSIS — D649 Anemia, unspecified: Secondary | ICD-10-CM

## 2019-04-11 LAB — VITAMIN B12: Vitamin B-12: 202 pg/mL — ABNORMAL LOW (ref 232–1245)

## 2019-04-19 ENCOUNTER — Other Ambulatory Visit: Payer: Self-pay | Admitting: Family Medicine

## 2019-04-19 NOTE — Telephone Encounter (Signed)
Requested medication (s) are due for refill today: no  Requested medication (s) are on the active medication list: yes  Last refill:  04/18/2019  Future visit scheduled:  Notes to clinic:  Historical provider, Requested for future use    Requested Prescriptions  Pending Prescriptions Disp Refills   Multiple Vitamin (MULTI-VITAMINS) TABS [Pharmacy Med Name: MULTI-VITAMINS TAB] 30 tablet     Sig: ONCE EVERY DAY     There is no refill protocol information for this order

## 2019-05-02 ENCOUNTER — Other Ambulatory Visit: Payer: Self-pay | Admitting: Family Medicine

## 2019-05-02 DIAGNOSIS — M10372 Gout due to renal impairment, left ankle and foot: Secondary | ICD-10-CM

## 2019-05-14 ENCOUNTER — Other Ambulatory Visit: Payer: Self-pay | Admitting: Family Medicine

## 2019-05-14 DIAGNOSIS — I10 Essential (primary) hypertension: Secondary | ICD-10-CM

## 2019-05-14 NOTE — Telephone Encounter (Signed)
Requested Prescriptions  Pending Prescriptions Disp Refills  . donepezil (ARICEPT) 5 MG tablet [Pharmacy Med Name: DONEPEZIL HCL 5 MG TAB] 90 tablet 0    Sig: TAKE 1 TABLET BY MOUTH EACH NIGHT AT BEDTIME FOR DEMENTIA W/O BEHAVIORAL DISTURBANCE     Neurology:  Alzheimer's Agents Passed - 05/14/2019 12:28 PM      Passed - Valid encounter within last 6 months    Recent Outpatient Visits          4 months ago Osteoarthritis of left hip, unspecified osteoarthritis type   [facility] [place], Jeannette How, [place]   11 months ago Essential hypertension   [place], Jeannette How, MD   2 years ago Annual physical exam   [facility] Guadalupe Maple, MD   2 years ago Viral URI   [place], [place], [place]   2 years ago Essential hypertension, benign   [place], [place]      Future Appointments            In 1 week [place], [facility]            . hydrochlorothiazide (HYDRODIURIL) 25 MG tablet [Pharmacy Med Name: HYDROCHLOROTHIAZIDE 25 MG TAB] 90 tablet 0    Sig: TAKE 1 TABLET BY MOUTH EVERY DAY FOR BLOOD PRESSURE     Cardiovascular: Diuretics - Thiazide Failed - 05/14/2019 12:28 PM      Failed - Cr in normal range and within 360 days    Creatinine, Ser  Date Value Ref Range Status  06/15/2018 1.72 (H) 0.57 - 1.00 mg/dL Final         Failed - Last BP in normal range    BP Readings from Last 1 Encounters:  12/28/18 (!) 183/79         Passed - Ca in normal range and within 360 days    Calcium  Date Value Ref Range Status  06/15/2018 9.7 8.7 - 10.3 mg/dL Final         Passed - K in normal range and within 360 days    Potassium  Date Value Ref Range Status  06/15/2018 4.5 3.5 - 5.2 mmol/L Final         Passed - Na in normal range and within 360 days    Sodium  Date Value Ref Range Status  06/15/2018 142 134 - 144 mmol/L Final         Passed - Valid encounter within last 6  months    Recent Outpatient Visits          4 months ago Osteoarthritis of left hip, unspecified osteoarthritis type   [facility], Jeannette How, MD   11 months ago Essential hypertension   [place], Jeannette How, MD   2 years ago Annual physical exam   [facility] Guadalupe Maple, MD   2 years ago Viral URI   [place], [place], [place]   2 years ago Essential hypertension, benign   [place], Jeannette How, MD      Future Appointments            In 1 week [place], [facility]            . calcitRIOL (ROCALTROL) 0.25 MCG capsule [Pharmacy Med Name: CALCITRIOL 0.25 MCG CAP] 90 capsule 0    Sig: TAKE 1 CAPSULE BY MOUTH ONCE DAILY FOR  CALCIUM DEFICIENCY     Endocrinology:  Vitamins - Vitamin D Supplementation Failed - 05/14/2019 12:28 PM      Failed - 50,000 IU strengths are not delegated      Failed - Phosphate in normal range and within 360 days    No results found for: PHOS       Failed - Vitamin D in normal range and within 360 days    No results found for: HU7654YT0, PT4656CL2, [id number], [place], [place], [place], 25OHVITD2, 25OHVITD1, 25OHVITD2, 25OHVITD3, VD25OH       Passed - Ca in normal range and within 360 days    Calcium  Date Value Ref Range Status  06/15/2018 9.7 8.7 - 10.3 mg/dL Final         Passed - Valid encounter within last 12 months    Recent Outpatient Visits          4 months ago Osteoarthritis of left hip, unspecified osteoarthritis type   [facility] [place], Jeannette How, MD   11 months ago Essential hypertension   [place], Jeannette How, MD   2 years ago Annual physical exam   [facility] Guadalupe Maple, MD   2 years ago Viral URI   [place], [place], [place]   2 years ago Essential hypertension, benign   [place], Jeannette How, MD       Future Appointments            In 1 week [facility], [place]

## 2019-05-21 ENCOUNTER — Other Ambulatory Visit: Payer: Self-pay | Admitting: Family Medicine

## 2019-05-21 DIAGNOSIS — I10 Essential (primary) hypertension: Secondary | ICD-10-CM

## 2019-05-21 NOTE — Telephone Encounter (Signed)
Routing to provider  

## 2019-05-21 NOTE — Telephone Encounter (Signed)
Requested medications are due for refill today?  Yes  Requested medications are on the active medication list?  Yes  Last refill 06/16/2019  Future visit scheduled?  Yes- 05/23/2019 for AWV.   Notes to clinic - Pharmacy is requesting refill - see their note, they are out of 100 mg tablets, will use 2 - 50 mg tablets.  Patient is due for CPE.  Requested Prescriptions  Pending Prescriptions Disp Refills   losartan (COZAAR) 100 MG tablet [Pharmacy Med Name: LOSARTAN POTASSIUM 100 MG TAB] 30 tablet     Sig: TAKE 1 TABLET BY MOUTH EVERY DAY FOR BLOOD PRESSURE     Cardiovascular:  Angiotensin Receptor Blockers Failed - 05/21/2019  1:05 PM      Failed - Cr in normal range and within 180 days    Creatinine, Ser  Date Value Ref Range Status  06/15/2018 1.72 (H) 0.57 - 1.00 mg/dL Final         Failed - K in normal range and within 180 days    Potassium  Date Value Ref Range Status  06/15/2018 4.5 3.5 - 5.2 mmol/L Final         Failed - Last BP in normal range    BP Readings from Last 1 Encounters:  12/28/18 (!) 183/79         Passed - Patient is not pregnant      Passed - Valid encounter within last 6 months    Recent Outpatient Visits          4 months ago Osteoarthritis of left hip, unspecified osteoarthritis type   [facility], Jeannette How, MD   11 months ago Essential hypertension   [place], Jeannette How, MD   2 years ago Annual physical exam   [facility] Guadalupe Maple, MD   2 years ago Viral URI   [place], [place], [place]   2 years ago Essential hypertension, benign   [place], Jeannette How, MD      Future Appointments            In 2 days [facility], [place]

## 2019-05-23 ENCOUNTER — Ambulatory Visit (INDEPENDENT_AMBULATORY_CARE_PROVIDER_SITE_OTHER): Payer: Medicare Other

## 2019-05-23 VITALS — BP 144/80 | HR 73 | Resp 20 | Wt 163.0 lb

## 2019-05-23 DIAGNOSIS — Z0000 Encounter for general adult medical examination without abnormal findings: Secondary | ICD-10-CM | POA: Diagnosis not present

## 2019-05-23 NOTE — Progress Notes (Signed)
Subjective:   Katie Saunders is a 85 y.o. female who presents for Medicare Annual (Subsequent) preventive examination.  This visit is being conducted via phone call  - after an attmept to do on video chat - due to the COVID-19 pandemic. This patient has given me verbal consent via phone to conduct this visit, patient states they are participating from their home address. Some vital signs may be absent or patient reported.   Patient identification: identified by name, DOB, and current address.   Some information assisted by Carmell Austria at assisted living facility.    Review of Systems:   Cardiac Risk Factors include: advanced age (>40men, >13 women)     Objective:     Vitals: BP (!) 144/80 Comment: pt reported from facility  Pulse 73 Comment: pt reported from facility  Resp 20 Comment: pt reported from facilty  Wt 163 lb (73.9 kg) Comment: pt reported  BMI 28.65 kg/m   Body mass index is 28.65 kg/m.  Advanced Directives 02/08/2018 09/15/2016 04/07/2016 04/05/2016 03/24/2016  Does Patient Have a Medical Advance Directive? Yes No Yes No No  Type of Paramedic of [place];Living will - [place] Kainan Patty in Chart? Yes - Yes - -  Would patient like information on creating a medical advance directive? - - - No - patient declined information -    Tobacco Social History   Tobacco Use  Smoking Status Never Smoker  Smokeless Tobacco Never Used     Counseling given: Not Answered   Clinical Intake:  Pre-visit preparation completed: Yes  Pain : No/denies pain     Diabetes: No  How often do you need to have someone help you when you read instructions, pamphlets, or other written materials from your doctor or pharmacy?: 1 - Never  Interpreter Needed?: No  Information entered by :: Varnika Butz,LPN  Past Medical History:  Diagnosis Date  . Anxiety   . Cancer [facility])    breast Left  . Cataract    . Dementia ([place])   . Depression   . Glaucoma   . Gout   . Hyperlipidemia   . Osteoporosis   . Postmastectomy lymphedema   . Renal insufficiency    Past Surgical History:  Procedure Laterality Date  . ABDOMINAL HYSTERECTOMY    . BREAST SURGERY    . EYE SURGERY    . KNEE ARTHROPLASTY Left 04/05/2016   Procedure: COMPUTER ASSISTED TOTAL KNEE ARTHROPLASTY;  Surgeon: Dereck Leep, MD;  Location: [facility] ORS;  Service: Orthopedics;  Laterality: Left;  . Left knee arthroscopy  09/19/2009   Dr. Margaretmary Eddy   Family History  Problem Relation Age of Onset  . Cancer Father    Social History   Socioeconomic History  . Marital status: Widowed    Spouse name: Not on file  . Number of children: Not on file  . Years of education: Not on file  . Highest education level: Not on file  Occupational History  . Not on file  Social Needs  . Financial resource strain: Not hard at all  . Food insecurity    Worry: Never true    Inability: Never true  . Transportation needs    Medical: No    Non-medical: No  Tobacco Use  . Smoking status: Never Smoker  . Smokeless tobacco: Never Used  Substance and Sexual Activity  . Alcohol use: No  . Drug use: No  .  Sexual activity: Not on file  Lifestyle  . Physical activity    Days per week: 0 days    Minutes per session: 0 min  . Stress: Not at all  Relationships  . Social connections    Talks on phone: More than three times a week    Gets together: More than three times a week    Attends religious service: More than 4 times per year    Active member of club or organization: No    Attends meetings of clubs or organizations: Never    Relationship status: Widowed  Other Topics Concern  . Not on file  Social History Narrative  . Not on file    Outpatient Encounter Medications as of 05/23/2019  Medication Sig  . calcitRIOL (ROCALTROL) 0.25 MCG capsule TAKE 1 CAPSULE BY MOUTH ONCE DAILY FOR CALCIUM DEFICIENCY  . COMBIGAN 0.2-0.5 % ophthalmic  solution Place 1 drop into both eyes every 12 (twelve) hours.   Marland Kitchen donepezil (ARICEPT) 5 MG tablet TAKE 1 TABLET BY MOUTH EACH NIGHT AT BEDTIME FOR DEMENTIA W/O BEHAVIORAL DISTURBANCE  . dorzolamide (TRUSOPT) 2 % ophthalmic solution Place 1 drop into the left eye 2 (two) times daily.   . febuxostat (ULORIC) 40 MG tablet TAKE 1 TABLET BY MOUTH ONCE DAILY FOR GOUT  . hydrochlorothiazide (HYDRODIURIL) 25 MG tablet TAKE 1 TABLET BY MOUTH EVERY DAY FOR BLOOD PRESSURE  . losartan (COZAAR) 100 MG tablet TAKE 1 TABLET BY MOUTH EVERY DAY FOR BLOOD PRESSURE  . LUMIGAN 0.01 % SOLN Place 1 drop into both eyes at bedtime.   . metoprolol tartrate (LOPRESSOR) 50 MG tablet TAKE 1 TABLET BY MOUTH EVERY DAY FOR BLOOD PRESSURE  . Multiple Vitamin (MULTI-VITAMINS) TABS ONCE EVERY DAY  . tiotropium (SPIRIVA HANDIHALER) 18 MCG inhalation capsule Place 1 capsule (18 mcg total) into inhaler and inhale daily.  Marland Kitchen VAYACOG 100-19.5-6.5 MG CAPS TAKE 1 CAPSULE BY MOUTH EVERY DAY   No facility-administered encounter medications on file as of 05/23/2019.     Activities of Daily Living In your present state of health, do you have any difficulty performing the following activities: 05/23/2019 06/15/2018  Hearing? N Y  Comment hearing aids, doesnt wear them -  Vision? N N  Difficulty concentrating or making decisions? Tempie Donning  Walking or climbing stairs? N Y  Comment - Wrote climbing stairs  Dressing or bathing? N N  Doing errands, shopping? Tempie Donning  Comment facility and family helps with transportation Needs assistance  Preparing Food and eating ? Y -  Comment facility doies -  Using the Toilet? N -  In the past six months, have you accidently leaked urine? N -  Do you have problems with loss of bowel control? N -  Managing your Medications? Y -  Managing your Finances? Y -  Comment daughter does -  Runner, broadcasting/film/video? Y -  Some recent data might be hidden    Patient Care Team: Guadalupe Maple, MD as  PCP - General (Family Medicine) Lollie Sails, MD as Consulting Physician (Gastroenterology) Bary Castilla, Forest Gleason, MD (General Surgery) Anthonette Legato, MD (Internal Medicine)    Assessment:   This is a routine wellness examination for Katie Saunders.  Exercise Activities and Dietary recommendations Current Exercise Habits: The patient does not participate in regular exercise at present, Exercise limited by: None identified  Goals    . DIET - INCREASE WATER INTAKE     recommend drinking at least 6-8 glasses of water  a day        Fall Risk: Fall Risk  05/23/2019 06/15/2018 02/08/2018 02/23/2017 12/01/2015  Falls in the past year? 1 Yes No No No  Number falls in past yr: 0 2 or more - - -  Injury with Fall? - Yes - - -  Comment - Fell on knee at table. (Completion helped by daughter) - - -    FALL RISK PREVENTION PERTAINING TO THE HOME:  Any stairs in or around the home? No  If so, are there any without handrails? No   Home free of loose throw rugs in walkways, pet beds, electrical cords, etc? Yes  Adequate lighting in your home to reduce risk of falls? Yes   ASSISTIVE DEVICES UTILIZED TO PREVENT FALLS:  Life alert? Yes  Use of a cane, walker or w/c? Yes  walker Grab bars in the bathroom? Yes  Shower chair or bench in shower? Yes  Elevated toilet seat or a handicapped toilet? Yes   DME ORDERS:  DME order needed?  No   TIMED UP AND GO:  Unable to perform    Depression Screen PHQ 2/9 Scores 05/23/2019 06/15/2018 02/08/2018 02/23/2017  PHQ - 2 Score 0 0 0 0  PHQ- 9 Score - 5 - -  Exception Documentation - (No Data) - -     Cognitive Function MMSE - Mini Mental State Exam 06/15/2018  Orientation to time 1  Orientation to Place 5  Registration 3  Attention/ Calculation 5  Recall 0  Language- name 2 objects 2  Language- repeat 1  Language- follow 3 step command 3  Language- read & follow direction 1  Write a sentence 1  Copy design 0  Copy design-comments Got 9 angles.   Total score 22     6CIT Screen 02/08/2018  What Year? 4 points  What month? 3 points  What time? 0 points  Count back from 20 0 points  Months in reverse 0 points  Repeat phrase 10 points  Total Score 17    Immunization History  Administered Date(s) Administered  . Influenza,inj,Quad PF,6+ Mos 08/17/2017  . Influenza-Unspecified 07/22/2014, 07/10/2015, 08/17/2016, 08/17/2018  . Pneumococcal Conjugate-13 05/20/2014  . Pneumococcal-Unspecified 01/16/1997, 07/06/2002  . Td 11/27/2014  . Zoster 03/08/2012    Qualifies for Shingles Vaccine? Yes  Zostavax completed 03/08/2012. Due for Shingrix. Education has been provided regarding the importance of this vaccine. Pt has been advised to call insurance company to determine out of pocket expense. Advised may also receive vaccine at local pharmacy or Health Dept. Verbalized acceptance and understanding.  Tdap: up to date   Flu Vaccine: up to date   Pneumococcal Vaccine: up to date   Screening Tests Health Maintenance  Topic Date Due  . INFLUENZA VACCINE  05/19/2019  . TETANUS/TDAP  11/27/2024  . DEXA SCAN  Completed  . PNA vac Low Risk Adult  Completed  . COLONOSCOPY  Discontinued    Cancer Screenings:  Colorectal Screening: no longer required  Mammogram: no longer required  Bone Density: no longer required  Lung Cancer Screening: (Low Dose CT Chest recommended if Age 61-80 years, 30 pack-year currently smoking OR have quit w/in 15years.) does not qualify.    Additional Screening:  Hepatitis C Screening: does not qualify  Vision Screening: Recommended annual ophthalmology exams for early detection of glaucoma and other disorders of the eye. Is the patient up to date with their annual eye exam?  Yes  Who is the provider or what is the  name of the office in which the pt attends annual eye exams? [facility]  .  Dental Screening: Recommended annual dental exams for proper oral hygiene  Community Resource Referral:   CRR required this visit?  No       Plan:  I have personally reviewed and addressed the Medicare Annual Wellness questionnaire and have noted the following in the patient's chart:  A. Medical and social history B. Use of alcohol, tobacco or illicit drugs  C. Current medications and supplements D. Functional ability and status E.  Nutritional status F.  Physical activity G. Advance directives H. List of other physicians I.  Hospitalizations, surgeries, and ER visits in previous 12 months J.  [place] such as hearing and vision if needed, cognitive and depression L. Referrals and appointments   In addition, I have reviewed and discussed with patient certain preventive protocols, quality metrics, and best practice recommendations. A written personalized care plan for preventive services as well as general preventive health recommendations were provided to patient.  Signed,    Bevelyn Ngo, LPN  12/25/5318 Nurse Health Advisor   Nurse Notes:none

## 2019-05-23 NOTE — Patient Instructions (Signed)
Katie Saunders , Thank you for taking time to come for your Medicare Wellness Visit. I appreciate your ongoing commitment to your health goals. Please review the following plan we discussed and let me know if I can assist you in the future.   Screening recommendations/referrals: Colonoscopy: no longer required Mammogram: no longer required Bone Density: no longer required Recommended yearly ophthalmology/optometry visit for glaucoma screening and checkup Recommended yearly dental visit for hygiene and checkup  Vaccinations: Influenza vaccine: up to date Pneumococcal vaccine: up to date Tdap vaccine: up to date Shingles vaccine: shingrix eligible, check with our insurance company for coverage     Advanced directives: copy on file    Next appointment: Follow up in one year for your annual wellness visit.    Preventive Care 83 Years and Older, Female Preventive care refers to lifestyle choices and visits with your health care provider that can promote health and wellness. What does preventive care include?  A yearly physical exam. This is also called an annual well check.  Dental exams once or twice a year.  Routine eye exams. Ask your health care provider how often you should have your eyes checked.  Personal lifestyle choices, including:  Daily care of your teeth and gums.  Regular physical activity.  Eating a healthy diet.  Avoiding tobacco and drug use.  Limiting alcohol use.  Practicing safe sex.  Taking low-dose aspirin every day.  Taking vitamin and mineral supplements as recommended by your health care provider. What happens during an annual well check? The services and screenings done by your health care provider during your annual well check will depend on your age, overall health, lifestyle risk factors, and family history of disease. Counseling  Your health care provider may ask you questions about your:  Alcohol use.  Tobacco use.  Drug use.   Emotional well-being.  Home and relationship well-being.  Sexual activity.  Eating habits.  History of falls.  Memory and ability to understand (cognition).  Work and work Statistician.  Reproductive health. Screening  You may have the following tests or measurements:  Height, weight, and BMI.  Blood pressure.  Lipid and cholesterol levels. These may be checked every 5 years, or more frequently if you are over 25 years old.  Skin check.  Lung cancer screening. You may have this screening every year starting at age 65 if you have a 30-pack-year history of smoking and currently smoke or have quit within the past 15 years.  Fecal occult blood test (FOBT) of the stool. You may have this test every year starting at age 19.  Flexible sigmoidoscopy or colonoscopy. You may have a sigmoidoscopy every 5 years or a colonoscopy every 10 years starting at age 12.  Hepatitis C blood test.  Hepatitis B blood test.  Sexually transmitted disease (STD) testing.  Diabetes screening. This is done by checking your blood sugar (glucose) after you have not eaten for a while (fasting). You may have this done every 1-3 years.  Bone density scan. This is done to screen for osteoporosis. You may have this done starting at age 31.  Mammogram. This may be done every 1-2 years. Talk to your health care provider about how often you should have regular mammograms. Talk with your health care provider about your test results, treatment options, and if necessary, the need for more tests. Vaccines  Your health care provider may recommend certain vaccines, such as:  Influenza vaccine. This is recommended every year.  Tetanus, diphtheria, and  acellular pertussis (Tdap, Td) vaccine. You may need a Td booster every 10 years.  Zoster vaccine. You may need this after age 49.  Pneumococcal 13-valent conjugate (PCV13) vaccine. One dose is recommended after age 32.  Pneumococcal polysaccharide (PPSV23)  vaccine. One dose is recommended after age 6. Talk to your health care provider about which screenings and vaccines you need and how often you need them. This information is not intended to replace advice given to you by your health care provider. Make sure you discuss any questions you have with your health care provider. Document Released: 10/31/2015 Document Revised: 06/23/2016 Document Reviewed: 08/05/2015 Elsevier Interactive Patient Education  2017 [place] Prevention in the Home Falls can cause injuries. They can happen to people of 83 years old. There are many things you can do to make your home safe and to help prevent falls. What can I do on the outside of my home?  Regularly fix the edges of walkways and driveways and fix any cracks.  Remove anything that might make you trip as you walk through a door, such as a raised step or threshold.  Trim any bushes or trees on the path to your home.  Use bright outdoor lighting.  Clear any walking paths of anything that might make someone trip, such as rocks or tools.  Regularly check to see if handrails are loose or broken. Make sure that both sides of any steps have handrails.  Any raised decks and porches should have guardrails on the edges.  Have any leaves, snow, or ice cleared regularly.  Use sand or salt on walking paths during winter.  Clean up any spills in your garage right away. This includes oil or grease spills. What can I do in the bathroom?  Use night lights.  Install grab bars by the toilet and in the tub and shower. Do not use towel bars as grab bars.  Use non-skid mats or decals in the tub or shower.  If you need to sit down in the shower, use a plastic, non-slip stool.  Keep the floor dry. Clean up any water that spills on the floor as soon as it happens.  Remove soap buildup in the tub or shower regularly.  Attach bath mats securely with double-sided non-slip rug tape.  Do not have throw rugs  and other things on the floor that can make you trip. What can I do in the bedroom?  Use night lights.  Make sure that you have a light by your bed that is easy to reach.  Do not use any sheets or blankets that are too big for your bed. They should not hang down onto the floor.  Have a firm chair that has side arms. You can use this for support while you get dressed.  Do not have throw rugs and other things on the floor that can make you trip. What can I do in the kitchen?  Clean up any spills right away.  Avoid walking on wet floors.  Keep items that you use a lot in easy-to-reach places.  If you need to reach something above you, use a strong step stool that has a grab bar.  Keep electrical cords out of the way.  Do not use floor polish or wax that makes floors slippery. If you must use wax, use non-skid floor wax.  Do not have throw rugs and other things on the floor that can make you trip. What can I do with my stairs?  Do not leave any items on the stairs.  Make sure that there are handrails on both sides of the stairs and use them. Fix handrails that are broken or loose. Make sure that handrails are as long as the stairways.  Check any carpeting to make sure that it is firmly attached to the stairs. Fix any carpet that is loose or worn.  Avoid having throw rugs at the top or bottom of the stairs. If you do have throw rugs, attach them to the floor with carpet tape.  Make sure that you have a light switch at the top of the stairs and the bottom of the stairs. If you do not have them, ask someone to add them for you. What else can I do to help prevent falls?  Wear shoes that:  Do not have high heels.  Have rubber bottoms.  Are comfortable and fit you well.  Are closed at the toe. Do not wear sandals.  If you use a stepladder:  Make sure that it is fully opened. Do not climb a closed stepladder.  Make sure that both sides of the stepladder are locked into  place.  Ask someone to hold it for you, if possible.  Clearly mark and make sure that you can see:  Any grab bars or handrails.  First and last steps.  Where the edge of each step is.  Use tools that help you move around (mobility aids) if they are needed. These include:  Canes.  Walkers.  Scooters.  Crutches.  Turn on the lights when you go into a dark area. Replace any light bulbs as soon as they burn out.  Set up your furniture so you have a clear path. Avoid moving your furniture around.  If any of your floors are uneven, fix them.  If there are any pets around you, be aware of where they are.  Review your medicines with your doctor. Some medicines can make you feel dizzy. This can increase your chance of falling. Ask your doctor what other things that you can do to help prevent falls. This information is not intended to replace advice given to you by your health care provider. Make sure you discuss any questions you have with your health care provider. Document Released: 07/31/2009 Document Revised: 03/11/2016 Document Reviewed: 11/08/2014 Elsevier Interactive Patient Education  2017 [employer].

## 2019-05-28 ENCOUNTER — Other Ambulatory Visit: Payer: Self-pay | Admitting: Family Medicine

## 2019-05-28 DIAGNOSIS — I10 Essential (primary) hypertension: Secondary | ICD-10-CM

## 2019-05-28 NOTE — Telephone Encounter (Signed)
Dose change to 50 mg BID, pharmacy currently out of 100 mg tablets.

## 2019-05-28 NOTE — Telephone Encounter (Signed)
Requested medications are due for refill today?  Yes  Requested medications are on the active medication list?  No- Losartan 100 mg is on medication list  Last refill:  05/21/2019, #30, 0 refills   Future visit scheduled?  No  Notes to clinic  Pharmacy requesting to dispense 50 mg tablets, and patient take 2 tablets per day as they are out of Losartan 100 mg.  Requested Prescriptions  Pending Prescriptions Disp Refills   losartan (COZAAR) 50 MG tablet [Pharmacy Med Name: LOSARTAN POTASSIUM 50 MG TAB] 60 tablet     Sig: TAKE 2 TABLETS BY MOUTH ONCE DAILY FOR BLOOD PRESSRE     Cardiovascular:  Angiotensin Receptor Blockers Failed - 05/28/2019  9:31 AM      Failed - Cr in normal range and within 180 days    Creatinine, Ser  Date Value Ref Range Status  06/15/2018 1.72 (H) 0.57 - 1.00 mg/dL Final         Failed - K in normal range and within 180 days    Potassium  Date Value Ref Range Status  06/15/2018 4.5 3.5 - 5.2 mmol/L Final         Failed - Last BP in normal range    BP Readings from Last 1 Encounters:  05/23/19 (!) 144/80         Passed - Patient is not pregnant      Passed - Valid encounter within last 6 months    Recent Outpatient Visits          5 months ago Osteoarthritis of left hip, unspecified osteoarthritis type   [facility] [place], Jeannette How, MD   11 months ago Essential hypertension   [place], Jeannette How, MD   2 years ago Annual physical exam   [facility] Guadalupe Maple, MD   2 years ago Viral URI   [place], [place], [place]   2 years ago Essential hypertension, benign   [facility] [place], Jeannette How, MD

## 2019-06-18 ENCOUNTER — Other Ambulatory Visit: Payer: Self-pay | Admitting: Family Medicine

## 2019-06-18 NOTE — Telephone Encounter (Signed)
Requested medication (s) are due for refill today: yes  Requested medication (s) are on the active medication list: yes  Last refill: 05/14/2019  Future visit scheduled:No  Notes to clinic:  Pt need physical    Requested Prescriptions  Pending Prescriptions Disp Refills   donepezil (ARICEPT) 5 MG tablet [Pharmacy Med Name: DONEPEZIL HCL 5 MG TAB] 90 tablet 0    Sig: TAKE 1 TABLET BY MOUTH EACH NIGHT AT BEDTIME FOR DEMENTIA W/O BEHAVIORAL DISTURBANCE     Neurology:  Alzheimer's Agents Passed - 06/18/2019  3:49 PM      Passed - Valid encounter within last 6 months    Recent Outpatient Visits          5 months ago Osteoarthritis of left hip, unspecified osteoarthritis type   [facility] Guadalupe Maple, MD   1 year ago Essential hypertension   [place], Jeannette How, MD   2 years ago Annual physical exam   [facility] Guadalupe Maple, MD   2 years ago Viral URI   [place], [place], [place]   2 years ago Essential hypertension, benign   [facility] [place], Jeannette How, MD

## 2019-06-19 NOTE — Telephone Encounter (Signed)
Routing to provider  

## 2019-07-09 ENCOUNTER — Other Ambulatory Visit: Payer: Self-pay | Admitting: Family Medicine

## 2019-07-09 DIAGNOSIS — M10372 Gout due to renal impairment, left ankle and foot: Secondary | ICD-10-CM

## 2019-07-09 NOTE — Telephone Encounter (Signed)
Requested medication (s) are due for refill today - yes  Requested medication (s) are on the active medication list - es  Future visit scheduled -no  Last refill: 05/02/19  Notes to clinic: Patient is over due for labs required for refill on this Rx- sent for PCP review  Requested Prescriptions  Pending Prescriptions Disp Refills   febuxostat (ULORIC) 40 MG tablet [Pharmacy Med Name: FEBUXOSTAT 40 MG TAB] 30 tablet 1    Sig: TAKE 1 TABLET BY MOUTH ONCE DAILY FOR GOUT     Endocrinology: Gout Agents - febuxostat & probenecid Failed - 07/09/2019  1:47 PM      Failed - Uric Acid in normal range and within 360 days    Uric Acid  Date Value Ref Range Status  06/15/2018 4.4 2.5 - 7.1 mg/dL Final    Comment:               Therapeutic target for gout patients: <6.0         Passed - Valid encounter within last 12 months    Recent Outpatient Visits          6 months ago Osteoarthritis of left hip, unspecified osteoarthritis type   [facility], Jeannette How, MD   1 year ago Essential hypertension   [place], Jeannette How, MD   2 years ago Annual physical exam   [facility], Jeannette How, MD   2 years ago Viral URI   [place], [place], [place]   2 years ago Essential hypertension, benign   [place], Jeannette How, MD                Requested Prescriptions  Pending Prescriptions Disp Refills   febuxostat (ULORIC) 40 MG tablet [Pharmacy Med Name: FEBUXOSTAT 40 MG TAB] 30 tablet 1    Sig: TAKE 1 TABLET BY MOUTH ONCE DAILY FOR GOUT     Endocrinology: Gout Agents - febuxostat & probenecid Failed - 07/09/2019  1:47 PM      Failed - Uric Acid in normal range and within 360 days    Uric Acid  Date Value Ref Range Status  06/15/2018 4.4 2.5 - 7.1 mg/dL Final    Comment:               Therapeutic target for gout patients: <6.0         Passed - Valid encounter within last 12 months   Recent Outpatient Visits          6 months ago Osteoarthritis of left hip, unspecified osteoarthritis type   [facility] Guadalupe Maple, MD   1 year ago Essential hypertension   [place], Jeannette How, MD   2 years ago Annual physical exam   [facility] Guadalupe Maple, MD   2 years ago Viral URI   [place], [place], [place]   2 years ago Essential hypertension, benign   [facility], Jeannette How, MD

## 2019-08-28 ENCOUNTER — Other Ambulatory Visit: Payer: Self-pay

## 2019-08-28 DIAGNOSIS — I10 Essential (primary) hypertension: Secondary | ICD-10-CM

## 2019-08-28 MED ORDER — METOPROLOL TARTRATE 50 MG PO TABS: ORAL_TABLET | ORAL | 0 refills | Status: DC

## 2019-08-28 NOTE — Telephone Encounter (Signed)
Called pt's daughter [name], she states that she will bring her in. Pt scheduled for 09/25/2019.

## 2019-08-28 NOTE — Telephone Encounter (Signed)
Needs appointment

## 2019-08-28 NOTE — Telephone Encounter (Signed)
[facility] faxed a Rx refill request on metoprolol tartrate 50 mg tab. Qty 90

## 2019-09-10 ENCOUNTER — Other Ambulatory Visit: Payer: Self-pay

## 2019-09-10 DIAGNOSIS — M10372 Gout due to renal impairment, left ankle and foot: Secondary | ICD-10-CM

## 2019-09-10 MED ORDER — FEBUXOSTAT 40 MG PO TABS: ORAL_TABLET | ORAL | 0 refills | Status: DC

## 2019-09-10 NOTE — Telephone Encounter (Signed)
Patient has upcoming appointment 09/25/19

## 2019-09-18 ENCOUNTER — Other Ambulatory Visit: Payer: Self-pay

## 2019-09-18 MED ORDER — CALCITRIOL 0.25 MCG PO CAPS: ORAL_CAPSULE | ORAL | 0 refills | Status: DC

## 2019-09-25 ENCOUNTER — Ambulatory Visit (INDEPENDENT_AMBULATORY_CARE_PROVIDER_SITE_OTHER): Payer: Medicare Other | Admitting: Family Medicine

## 2019-09-25 ENCOUNTER — Encounter: Payer: Self-pay | Admitting: Family Medicine

## 2019-09-25 VITALS — BP 139/79

## 2019-09-25 DIAGNOSIS — F0280 Dementia in other diseases classified elsewhere without behavioral disturbance: Secondary | ICD-10-CM

## 2019-09-25 DIAGNOSIS — E538 Deficiency of other specified B group vitamins: Secondary | ICD-10-CM

## 2019-09-25 DIAGNOSIS — E785 Hyperlipidemia, unspecified: Secondary | ICD-10-CM | POA: Diagnosis not present

## 2019-09-25 DIAGNOSIS — N184 Chronic kidney disease, stage 4 (severe): Secondary | ICD-10-CM

## 2019-09-25 DIAGNOSIS — I10 Essential (primary) hypertension: Secondary | ICD-10-CM

## 2019-09-25 DIAGNOSIS — M10372 Gout due to renal impairment, left ankle and foot: Secondary | ICD-10-CM

## 2019-09-25 DIAGNOSIS — G301 Alzheimer's disease with late onset: Secondary | ICD-10-CM

## 2019-09-25 DIAGNOSIS — E211 Secondary hyperparathyroidism, not elsewhere classified: Secondary | ICD-10-CM | POA: Diagnosis not present

## 2019-09-25 DIAGNOSIS — D500 Iron deficiency anemia secondary to blood loss (chronic): Secondary | ICD-10-CM

## 2019-09-25 DIAGNOSIS — I129 Hypertensive chronic kidney disease with stage 1 through stage 4 chronic kidney disease, or unspecified chronic kidney disease: Secondary | ICD-10-CM

## 2019-09-25 MED ORDER — LOSARTAN POTASSIUM 100 MG PO TABS: ORAL_TABLET | ORAL | 1 refills | Status: DC

## 2019-09-25 MED ORDER — DONEPEZIL HCL 5 MG PO TABS: ORAL_TABLET | ORAL | 1 refills | Status: DC

## 2019-09-25 MED ORDER — FEBUXOSTAT 40 MG PO TABS: ORAL_TABLET | ORAL | 1 refills | Status: DC

## 2019-09-25 MED ORDER — CALCITRIOL 0.25 MCG PO CAPS: ORAL_CAPSULE | ORAL | 1 refills | Status: DC

## 2019-09-25 MED ORDER — SPIRIVA HANDIHALER 18 MCG IN CAPS: 18.0000 ug | ORAL_CAPSULE | Freq: Every day | RESPIRATORY_TRACT | 3 refills | Status: DC

## 2019-09-25 MED ORDER — METOPROLOL TARTRATE 50 MG PO TABS: ORAL_TABLET | ORAL | 1 refills | Status: DC

## 2019-09-25 MED ORDER — HYDROCHLOROTHIAZIDE 25 MG PO TABS: ORAL_TABLET | ORAL | 1 refills | Status: DC

## 2019-09-25 NOTE — Assessment & Plan Note (Signed)
Under good control on current regimen. Continue current regimen. Continue to monitor. Call with any concerns. Refills given. Labs to be drawn at home place.

## 2019-09-25 NOTE — Assessment & Plan Note (Signed)
Following with nephrology. Will check PTH and Ca and forward results. Continue to onitor.

## 2019-09-25 NOTE — Assessment & Plan Note (Signed)
Living at home place. Has lots of help. Continue donzepil. Call with any concerns.

## 2019-09-25 NOTE — Assessment & Plan Note (Signed)
Not currently on any medicine. Will check labs and treat as needed. Continue to monitor.

## 2019-09-25 NOTE — Assessment & Plan Note (Signed)
Will recheck levels and treat as needed. Await results. Call with any concerns.

## 2019-09-25 NOTE — Progress Notes (Signed)
BP 139/79    Subjective:    Patient ID: Katie Saunders, female    DOB: 09/22/1931, 85 y.o.   MRN: [id number]  HPI: Katie Saunders is a 85 y.o. female  Chief Complaint  Patient presents with  . Hypertension  . Hyperlipidemia  . Gout  . Anemia   HYPERTENSION / [place] Satisfied with current treatment? yes Duration of hypertension: chronic BP monitoring frequency: not checking BP medication side effects: no Past BP meds: losartan, hctz, metoprolol Duration of hyperlipidemia: chronic Cholesterol medication side effects: no Cholesterol supplements: fish oil Medication compliance: excellent compliance Aspirin: no Recent stressors: no Recurrent headaches: no Visual changes: no Palpitations: no Dyspnea: no Chest pain: no Lower extremity edema: no Dizzy/lightheaded: no  No gout flares- feeling well.   Relevant past medical, surgical, family and social history reviewed and updated as indicated. Interim medical history since our last visit reviewed. Allergies and medications reviewed and updated.  Review of Systems  Constitutional: Negative.   Respiratory: Negative.   Cardiovascular: Negative.   Gastrointestinal: Negative.   Psychiatric/Behavioral: Negative.     Per HPI unless specifically indicated above     Objective:    BP 139/79   Wt Readings from Last 3 Encounters:  05/23/19 163 lb (73.9 kg)  12/28/18 172 lb 8 oz (78.2 kg)  06/15/18 173 lb (78.5 kg)    Physical Exam Vitals signs and nursing note reviewed.  Constitutional:      General: She is not in acute distress.    Appearance: Normal appearance. She is not ill-appearing, toxic-appearing or diaphoretic.  HENT:     Head: Normocephalic and atraumatic.     Right Ear: External ear normal.     Left Ear: External ear normal.     Nose: Nose normal.     Mouth/Throat:     Mouth: Mucous membranes are moist.     Pharynx: Oropharynx is clear.  Eyes:     General: No scleral icterus.       Right  eye: No discharge.        Left eye: No discharge.     Conjunctiva/sclera: Conjunctivae normal.     Pupils: Pupils are equal, round, and reactive to light.  Neck:     Musculoskeletal: Normal range of motion.  Pulmonary:     Effort: Pulmonary effort is normal. No respiratory distress.     Comments: Speaking in full sentences Musculoskeletal: Normal range of motion.  Skin:    Coloration: Skin is not jaundiced or pale.     Findings: No bruising, erythema, lesion or rash.  Neurological:     Mental Status: She is alert and oriented to person, place, and time. Mental status is at baseline.  Psychiatric:        Mood and Affect: Mood normal.        Behavior: Behavior normal.        Thought Content: Thought content normal.        Judgment: Judgment normal.     Results for orders placed or performed in visit on 04/10/19  Vitamin B12  Result Value Ref Range   Vitamin B-12 202 (L) 232 - 1,245 pg/mL      Assessment & Plan:   Problem List Items Addressed This Visit      Endocrine   Secondary hyperparathyroidism, non-renal [facility])    Following with nephrology. Will check PTH and Ca and forward results. Continue to onitor.       Relevant Orders   PTH, Intact  and Calcium     Nervous and Auditory   Dementia ([place])    Living at home place. Has lots of help. Continue donzepil. Call with any concerns.       Relevant Medications   donepezil (ARICEPT) 5 MG tablet     Genitourinary   CKD stage 4 secondary to hypertension ([place])    Getting worse. Does not want to do dialysis per last nephrology note. Continue to follow with nephrology. Call with any concerns.       Relevant Orders   Comp Met (CMET)   UA/M w/rflx Culture, Routine   Benign hypertensive renal disease - Primary    Under good control on current regimen. Continue current regimen. Continue to monitor. Call with any concerns. Refills given. Labs to be drawn at home place.       Relevant Orders   Comp Met (CMET)    Microalbumin, Urine Waived   TSH     Other   Hyperlipidemia    Not currently on any medicine. Will check labs and treat as needed. Continue to monitor.       Relevant Medications   metoprolol tartrate (LOPRESSOR) 50 MG tablet   losartan (COZAAR) 100 MG tablet   hydrochlorothiazide (HYDRODIURIL) 25 MG tablet   Other Relevant Orders   Lipid Panel w/o Chol/HDL Ratio OUT   Gout    Under good control on current regimen. Continue current regimen. Continue to monitor. Call with any concerns. Refills given. Labs to be drawn at home place.       Relevant Medications   febuxostat (ULORIC) 40 MG tablet   Other Relevant Orders   Uric acid   Iron deficiency anemia    Will recheck levels and treat as needed. Await results. Call with any concerns.       Relevant Orders   CBC with Differential OUT   Iron Binding Cap (TIBC)   B12 deficiency    Will recheck levels and treat as needed. Await results. Call with any concerns.       Relevant Orders   CBC with Differential OUT   B12 and Folate Panel    Other Visit Diagnoses    Essential hypertension, benign       Relevant Medications   metoprolol tartrate (LOPRESSOR) 50 MG tablet   losartan (COZAAR) 100 MG tablet   hydrochlorothiazide (HYDRODIURIL) 25 MG tablet       Follow up plan: Return in about 6 months (around 03/25/2020).   . This visit was completed via FaceTime due to the restrictions of the COVID-19 pandemic. All issues as above were discussed and addressed. Physical exam was done as above through visual confirmation on FaceTime. If it was felt that the patient should be evaluated in the office, they were directed there. The patient verbally consented to this visit. . Location of the patient: home with med tech present . Location of the provider: work . Those involved with this call:  . Provider: Park Liter, DO . CMA: Tiffany Reel, CMA . Front Desk/Registration: Don Perking  . Time spent on call: 25 minutes with  patient face to face via video conference. More than 50% of this time was spent in counseling and coordination of care. 40 minutes total spent in review of patient's record and preparation of their chart.

## 2019-09-25 NOTE — Assessment & Plan Note (Signed)
Getting worse. Does not want to do dialysis per last nephrology note. Continue to follow with nephrology. Call with any concerns.

## 2019-11-13 ENCOUNTER — Telehealth: Payer: Self-pay | Admitting: Pulmonary Disease

## 2019-11-13 ENCOUNTER — Telehealth: Payer: Self-pay | Admitting: Family Medicine

## 2019-11-13 NOTE — Telephone Encounter (Signed)
Katie Saunders with [facility] stated pt tested positive for Covid 19 this weekend. Her pulse ox was 92 this morning. Also stated pt has not been eating. They would like to know what Dr. [name] recommends or if pt should go to ED. Please advise.  CB# [phone] ask for [name]

## 2019-11-13 NOTE — Telephone Encounter (Signed)
Anytime, great teamwork!Aaron Edelman

## 2019-11-13 NOTE — Telephone Encounter (Signed)
Would recommend evaluation- we can do that virtually. Would qualify for infusions (copying Jolene).

## 2019-11-13 NOTE — Telephone Encounter (Signed)
Needs virtual appt ASAP

## 2019-11-13 NOTE — Telephone Encounter (Signed)
11/13/2019  Received referral for patient from primary care Dr. [name].  Reached out and spoke with patient's daughter Katie Saunders.  Katie Saunders noticed that on a 11/06/2019 on the Zoom call that her mother looked more lethargic.  Unsure of exact symptom onset.  On 11/08/2019 patient was tested positive for Covid at her nursing home and home place in [place], [place].  Patient is currently symptomatic with lethargy, struggling with diet as well as drinking fluids, and a dry cough.  Unfortunately patient has baseline dementia which makes it difficult to ascertain when symptoms initially started.  Discussed case with Dr. Joya Gaskins.  Due to the fact that we are unable to ascertain when patient symptoms started as well as with her baseline history of dementia she would not qualify for the monoclonal antibody infusion.  I discussed this with patient's daughter Katie Saunders she agrees.  Our concern would be that if patient is actually outside of the 10-day window of symptom onset that the infusion could actually worsen her symptoms.  We will route message to PCP as well as Marnee Guarneri, NP as Juluis Rainier.  Thank you for the referral.  Wyn Quaker, FNP

## 2019-11-13 NOTE — Telephone Encounter (Signed)
Have reached out to team to call, if they are unable I will try this afternoon.

## 2019-11-13 NOTE — Telephone Encounter (Signed)
Thank you so much Aaron Edelman for reaching out to her.

## 2019-11-14 NOTE — Telephone Encounter (Signed)
Scheduled virtual for tomorrow

## 2019-11-15 ENCOUNTER — Encounter: Payer: Self-pay | Admitting: Family Medicine

## 2019-11-15 ENCOUNTER — Ambulatory Visit (INDEPENDENT_AMBULATORY_CARE_PROVIDER_SITE_OTHER): Payer: Medicare PPO | Admitting: Family Medicine

## 2019-11-15 DIAGNOSIS — U071 COVID-19: Secondary | ICD-10-CM | POA: Diagnosis not present

## 2019-11-15 MED ORDER — PREDNISONE 10 MG PO TABS: ORAL_TABLET | ORAL | 0 refills | Status: DC

## 2019-11-15 NOTE — Progress Notes (Signed)
There were no vitals taken for this visit.   Subjective:    Patient ID: Katie Saunders, female    DOB: 1931/01/14, 86 y.o.   MRN: [id number]  HPI: Katie Saunders is a 86 y.o. female  Chief Complaint  Patient presents with  . covid positive    Patient is really only eating breakfast, she does not want to eat lunch or dinner  . Medication Problem    Patient does not want to take Norma Fredrickson so they would like a discontinue order faxed to [phone]   UPPER RESPIRATORY TRACT INFECTION- visit conducted with help of Horris Latino- who works at her facility as patient is very hard of hearing. Duration: At least 11/06/19 Worst symptom: loss of appetite, fatigue Fever: no Cough: no Shortness of breath: no Wheezing: no Chest pain: no Chest tightness: no Chest congestion: no Nasal congestion: no Runny nose: no Post nasal drip: no Sneezing: no Sore throat: no Swollen glands: no Sinus pressure: no Headache: no Face pain: no Toothache: no Ear pain: no  Ear pressure: no  Eyes red/itching:no Eye drainage/crusting: no  Vomiting: no Rash: no Fatigue: yes Sick contacts: yes Strep contacts: no  Context: stable Recurrent sinusitis: no Relief with OTC cold/cough medications: no  Treatments attempted: none   Relevant past medical, surgical, family and social history reviewed and updated as indicated. Interim medical history since our last visit reviewed. Allergies and medications reviewed and updated.  Review of Systems  Constitutional: Positive for fatigue. Negative for activity change, appetite change, chills, diaphoresis, fever and unexpected weight change.  HENT: Negative.   Respiratory: Negative.   Cardiovascular: Negative.   Psychiatric/Behavioral: Positive for confusion. Negative for agitation, behavioral problems, decreased concentration, dysphoric mood, hallucinations, self-injury, sleep disturbance and suicidal ideas. The patient is not nervous/anxious and is not  hyperactive.     Per HPI unless specifically indicated above     Objective:    There were no vitals taken for this visit.  Wt Readings from Last 3 Encounters:  05/23/19 163 lb (73.9 kg)  12/28/18 172 lb 8 oz (78.2 kg)  06/15/18 173 lb (78.5 kg)    Physical Exam Vitals and nursing note reviewed.  Constitutional:      General: She is not in acute distress.    Appearance: Normal appearance. She is not ill-appearing, toxic-appearing or diaphoretic.  HENT:     Head: Normocephalic and atraumatic.     Right Ear: External ear normal.     Left Ear: External ear normal.     Nose: Nose normal.     Mouth/Throat:     Mouth: Mucous membranes are moist.     Pharynx: Oropharynx is clear.  Eyes:     General: No scleral icterus.       Right eye: No discharge.        Left eye: No discharge.     Conjunctiva/sclera: Conjunctivae normal.     Pupils: Pupils are equal, round, and reactive to light.  Pulmonary:     Effort: Pulmonary effort is normal. No respiratory distress.     Comments: Speaking in full sentences Musculoskeletal:        General: Normal range of motion.     Cervical back: Normal range of motion.  Skin:    Coloration: Skin is not jaundiced or pale.     Findings: No bruising, erythema, lesion or rash.  Neurological:     Mental Status: She is alert and oriented to person, place, and time. Mental status is  at baseline.  Psychiatric:        Mood and Affect: Mood normal.        Behavior: Behavior normal.        Thought Content: Thought content normal.        Judgment: Judgment normal.     Results for orders placed or performed in visit on 04/10/19  Vitamin B12  Result Value Ref Range   Vitamin B-12 202 (L) 232 - 1,245 pg/mL      Assessment & Plan:   Problem List Items Addressed This Visit    None    Visit Diagnoses    COVID-19    -  Primary   Thankfully patient seems to have mild symptoms. Will send some prednisone. Continue symptomatic care and quarantine. Monitor  closely. Recheck 1 wk.       Follow up plan: Return in about 1 week (around 11/22/2019) for follow up COVID.   Marland Kitchen This visit was completed via FaceTime due to the restrictions of the COVID-19 pandemic. All issues as above were discussed and addressed. Physical exam was done as above through visual confirmation on FaceTime. If it was felt that the patient should be evaluated in the office, they were directed there. The patient verbally consented to this visit. . Location of the patient: home . Location of the provider: work . Those involved with this call:  . Provider: Park Liter, DO . CMA: Tiffany Reel, CMA . Front Desk/Registration: Don Perking  . Time spent on call: 15 minutes with patient face to face via video conference. More than 50% of this time was spent in counseling and coordination of care. 23 minutes total spent in review of patient's record and preparation of their chart.

## 2019-11-17 ENCOUNTER — Encounter: Payer: Self-pay | Admitting: Family Medicine

## 2019-11-28 ENCOUNTER — Telehealth: Payer: Medicare PPO | Admitting: Family Medicine

## 2019-11-29 ENCOUNTER — Telehealth: Payer: Self-pay | Admitting: Family Medicine

## 2019-11-29 NOTE — Telephone Encounter (Signed)
Patient was supposed to have a follow up. Appears to have cancelled it. If they are having a lot of issues with her and her COVID I would advise her have an appointment at the respiratory clinic as she could not hear me over the virtual visit last time and it was not particularly helpful

## 2019-11-29 NOTE — Telephone Encounter (Signed)
If she's better with COVID she can stop the prednisone

## 2019-11-29 NOTE — Telephone Encounter (Signed)
Copied from [place] [id number]. Topic: General - Inquiry >> Nov 29, 2019  1:10 PM Mathis Bud wrote: Reason for CRM: Alea from [facility] [place] called stating patient will not take new prescription of  predniSONE (DELTASONE) 10 MG tablet Patient states she is used to see is 5 not 6 so she is not wanting to take the 6.  Alea is requesting a change of medication Call back [phone] FAX [phone]

## 2019-11-29 NOTE — Telephone Encounter (Signed)
Spoke with Katie Saunders again and verbalized understanding. She will fax [place] a discontinue order.

## 2019-11-29 NOTE — Telephone Encounter (Signed)
Alea was gone for the day but spoke with the director. She stated patient isn't having anymore trouble with COVID, she's just refusing to take her medications including prednisone. States her dementia has an effect on it. But as far as COVID symptoms patient is  a lot better and back up and doing things.

## 2019-12-25 ENCOUNTER — Telehealth: Payer: Self-pay | Admitting: Family Medicine

## 2019-12-25 NOTE — Telephone Encounter (Signed)
[facility] is requesting to d/c pt's tiotropium (SPIRIVA HANDIHALER) 18 MCG inhalation capsule Stated pt has been refusing to take medication. Please advise.  Fax [phone]

## 2019-12-25 NOTE — Telephone Encounter (Signed)
Routing to provider  

## 2019-12-27 ENCOUNTER — Telehealth: Payer: Self-pay | Admitting: Family Medicine

## 2019-12-27 NOTE — Telephone Encounter (Signed)
I would not advise her to stop the medicine.

## 2019-12-27 NOTE — Telephone Encounter (Signed)
Copied from [place] [id number]. Topic: General - Other >> Dec 27, 2019 11:11 AM Keene Breath wrote: Reason for CRM: Called to discuss patient's medication, tiotropium (SPIRIVA HANDIHALER) 18 MCG inhalation capsule.  Would like to know why it was discontinued.  Please call to discuss at [phone]

## 2019-12-27 NOTE — Telephone Encounter (Signed)
[facility] [place] called again waiting on the Dc order so that they can stop this medication in the original message. Please advise ASAP.Marland Kitchen

## 2019-12-27 NOTE — Telephone Encounter (Signed)
Home place notified to not discontinue the medication.

## 2019-12-27 NOTE — Telephone Encounter (Signed)
Home care notified that they can not discontinue medication.

## 2019-12-27 NOTE — Telephone Encounter (Signed)
I have not discontinued this medicine. I do not recommend discontinuing it. They want me to discontinue it because she's refusing to take it. I don't understand this message.

## 2020-01-08 ENCOUNTER — Other Ambulatory Visit: Payer: Self-pay | Admitting: Family Medicine

## 2020-01-08 DIAGNOSIS — M10372 Gout due to renal impairment, left ankle and foot: Secondary | ICD-10-CM

## 2020-01-08 NOTE — Telephone Encounter (Signed)
Medication Refill - Medication:   Tylenol 325 Generic Aricept 5 mg Uloric 40 mg   Has the patient contacted their pharmacy? No. (Agent: If no, request that the patient contact the pharmacy for the refill.) (Agent: If yes, when and what did the pharmacy advise?)  Preferred Pharmacy (with phone number or street name): [facility]  [address]  Agent: Please be advised that RX refills may take up to 3 business days. We ask that you follow-up with your pharmacy.

## 2020-01-08 NOTE — Telephone Encounter (Signed)
Requested medication (s) are due for refill today:   Yes for Uloric and Aricept.   Tylenol Rx is from a historical provider  Requested medication (s) are on the active medication list:   Yes  Future visit scheduled:   No   Last ordered: Uloric and Aricept were on 09/25/2019.      Clinic note:   Returned because the Tylenol is ordered from a historical provider.      Requested Prescriptions  Pending Prescriptions Disp Refills   febuxostat (ULORIC) 40 MG tablet 90 tablet 1    Sig: TAKE 1 TABLET BY MOUTH ONCE DAILY FOR GOUT      Endocrinology: Gout Agents - febuxostat & probenecid Failed - 01/08/2020 10:55 AM      Failed - Uric Acid in normal range and within 360 days    Uric Acid  Date Value Ref Range Status  06/15/2018 4.4 2.5 - 7.1 mg/dL Final    Comment:               Therapeutic target for gout patients: <6.0          Passed - Valid encounter within last 12 months    Recent Outpatient Visits           1 month ago [place], [place], DO   3 months ago Benign hypertensive renal disease   [facility] [place], [name], DO   1 year ago Osteoarthritis of left hip, unspecified osteoarthritis type   [facility], Jeannette How, MD   1 year ago Essential hypertension   [place] [facility], Jeannette How, MD   2 years ago Annual physical exam   [facility], Mark A, MD                donepezil (ARICEPT) 5 MG tablet 90 tablet 1    Sig: TAKE 1 TABLET BY MOUTH EACH NIGHT AT BEDTIME FOR DEMENTIA W/O BEHAVIORAL DISTURBANCE      Neurology:  Alzheimer's Agents Passed - 01/08/2020 10:55 AM      Passed - Valid encounter within last 6 months    Recent Outpatient Visits           1 month ago [place], [place], DO   3 months ago Benign hypertensive renal disease   [facility] [place], [name], DO   1 year ago Osteoarthritis of left hip,  unspecified osteoarthritis type   [facility] [place], Jeannette How, MD   1 year ago Essential hypertension   [place], Jeannette How, MD   2 years ago Annual physical exam   [facility] [place], Jeannette How, MD                acetaminophen (TYLENOL) 325 MG tablet      Sig: Take 2 tablets (650 mg total) by mouth every 6 (six) hours as needed.      Over the Counter:  OTC Passed - 01/08/2020 10:55 AM      Passed - Valid encounter within last 12 months    Recent Outpatient Visits           1 month ago [place], [place] P, DO   3 months ago Benign hypertensive renal disease   [facility] [place], [name], DO   1 year ago Osteoarthritis of left hip, unspecified osteoarthritis type  [facility], Jeannette How, MD   1 year ago Essential hypertension   [place], Jeannette How, MD   2 years ago Annual physical exam   [facility], Jeannette How, MD

## 2020-01-08 NOTE — Telephone Encounter (Signed)
LOV 11/15/19

## 2020-02-18 ENCOUNTER — Other Ambulatory Visit: Payer: Self-pay | Admitting: Family Medicine

## 2020-02-18 DIAGNOSIS — M10372 Gout due to renal impairment, left ankle and foot: Secondary | ICD-10-CM

## 2020-02-18 NOTE — Telephone Encounter (Signed)
Requested Prescriptions  Pending Prescriptions Disp Refills  . febuxostat (ULORIC) 40 MG tablet [Pharmacy Med Name: FEBUXOSTAT 40 MG TAB] 90 tablet 0    Sig: TAKE 1 TABLET BY MOUTH EVERY DAY FOR GOUT     Endocrinology: Gout Agents - febuxostat & probenecid Failed - 02/18/2020 11:01 AM      Failed - Uric Acid in normal range and within 360 days    Uric Acid  Date Value Ref Range Status  06/15/2018 4.4 2.5 - 7.1 mg/dL Final    Comment:               Therapeutic target for gout patients: <6.0         Passed - Valid encounter within last 12 months    Recent Outpatient Visits          3 months ago [place], [place], DO   4 months ago Benign hypertensive renal disease   [facility] [place], [name], DO   1 year ago Osteoarthritis of left hip, unspecified osteoarthritis type   [facility], Jeannette How, MD   1 year ago Essential hypertension   [place], Jeannette How, MD   2 years ago Annual physical exam   [facility], Jeannette How, MD

## 2020-02-21 NOTE — Telephone Encounter (Addendum)
Called [facility]. Spoke with Rollene Fare who works night shift, Coralyn Pear had went home for the day. Rollene Fare stated at night she only the Pt only receives donepezil and she refuses this. Rollene Fare also stated that when she has worked the day shift, Emma gets very hostile towards staff about medications. Telena tells staff "Get the hell out of my room".

## 2020-02-21 NOTE — Telephone Encounter (Signed)
That is not appropriate to stop.

## 2020-02-21 NOTE — Telephone Encounter (Signed)
Tried to call [facility], no answer, will try again.

## 2020-02-21 NOTE — Telephone Encounter (Signed)
Katie Saunders with [facility] called again in regards to discontinuing medication. States patient has been refusing this medication, and several others, for the past month or so. Please advise if an order for the discontinuation can be placed, as family is being charged for this medication. Fax is [phone]. Call back is [phone]

## 2020-02-21 NOTE — Telephone Encounter (Signed)
What medications?

## 2020-02-22 NOTE — Telephone Encounter (Signed)
Tried to call [facility], no answer, will try again.

## 2020-02-25 NOTE — Telephone Encounter (Signed)
Alia notified to continue to try to give the patient the medication daily.

## 2020-03-05 ENCOUNTER — Telehealth: Payer: Self-pay | Admitting: Family Medicine

## 2020-03-05 DIAGNOSIS — N186 End stage renal disease: Secondary | ICD-10-CM

## 2020-03-05 DIAGNOSIS — F0280 Dementia in other diseases classified elsewhere without behavioral disturbance: Secondary | ICD-10-CM

## 2020-03-05 NOTE — Telephone Encounter (Signed)
PT daughter called in stating that she wants her mom to have hospice care. Daughter stated that mom has severe dementia daughter stated that the mom has end stages of Kidney Disease and stated that the DR said there is nothing else he can do for her. Last appt 11/15/2019

## 2020-03-05 NOTE — Telephone Encounter (Signed)
OK. [facility]'ll place hospice orders

## 2020-03-06 NOTE — Telephone Encounter (Signed)
Called and spoke with med tech [place], she will speak with the doctors doing house calls provider to see if she can sign off on the hospice order, if she cant she will let us know.

## 2020-03-06 NOTE — Telephone Encounter (Signed)
Katie Saunders with [facility] is calling back to close out... stating Pt is at [facility] in [place] off [address]. [facility] Contact # [phone].

## 2020-03-06 NOTE — Telephone Encounter (Signed)
If the doctor at [facility] will serve as attending, that may be better. Katie Saunders does not remember who I am and has been refusing to take any medicine I prescribe because I'm not Dr. Jeananne Rama. I'm happy to continue to serve as her attending, but if there is an option for an in-house provider, that would likely be better.

## 2020-03-06 NOTE — Telephone Encounter (Signed)
FYI Spoke with someone at [facility], they will fax over the form that they need to be signed to start hospice.

## 2020-03-06 NOTE — Telephone Encounter (Signed)
Kristie with [facility] called wanting to know if Dr. [name] is going to be the attending physician with the hospice care for patient/  CB#  [phone]

## 2020-03-07 NOTE — Telephone Encounter (Signed)
Called [place] back and read her the messages from [place] and Dr. [name]. She states that she will contact [facility] to see where they stand and if they need anything from [place], then she will call us back.

## 2020-03-07 NOTE — Telephone Encounter (Signed)
Caller name:Kristie from [facility] Relation to pt: referral intake  Call back number: [phone]   Reason for call:  Checking on the status of hospice orders stating she would like to speak with the nurse today. Patient daughter is coming into town on Monday 03/10/2020 and would like this resolve, please advise

## 2020-05-10 ENCOUNTER — Other Ambulatory Visit: Payer: Self-pay | Admitting: Family Medicine

## 2020-05-10 DIAGNOSIS — M10372 Gout due to renal impairment, left ankle and foot: Secondary | ICD-10-CM

## 2020-05-10 NOTE — Telephone Encounter (Signed)
Requested medication (s) are due for refill today: yes  Requested medication (s) are on the active medication list: yes  Last refill:  02/18/20  Future visit scheduled: no  Notes to clinic:  overdue lab work   Requested Prescriptions  Pending Prescriptions Disp Refills   febuxostat (ULORIC) 40 MG tablet [Pharmacy Med Name: FEBUXOSTAT 40 MG TAB] 90 tablet 0    Sig: TAKE 1 TABLET BY MOUTH EVERY DAY FOR GOUT      Endocrinology: Gout Agents - febuxostat & probenecid Failed - 05/10/2020  9:06 AM      Failed - Uric Acid in normal range and within 360 days    Uric Acid  Date Value Ref Range Status  06/15/2018 4.4 2.5 - 7.1 mg/dL Final    Comment:               Therapeutic target for gout patients: <6.0          Passed - Valid encounter within last 12 months    Recent Outpatient Visits           5 months ago [place], Candy Kitchen, DO   7 months ago Benign hypertensive renal disease   [facility] [place], [name], DO   1 year ago Osteoarthritis of left hip, unspecified osteoarthritis type   [facility] [place], Jeannette How, MD   1 year ago Essential hypertension   [place], Jeannette How, MD   3 years ago Annual physical exam   [facility] [place], Jeannette How, MD

## 2020-05-12 ENCOUNTER — Other Ambulatory Visit: Payer: Self-pay | Admitting: Family Medicine

## 2020-05-12 DIAGNOSIS — M10372 Gout due to renal impairment, left ankle and foot: Secondary | ICD-10-CM

## 2020-05-12 NOTE — Telephone Encounter (Signed)
Called patient's daughter [name] and spoke with her. She stated patient has and appointment tomorrow with the facility Dr. Trenton Gammon to request the facility's Dr for refills. Patient agreed with this.

## 2020-05-12 NOTE — Telephone Encounter (Signed)
Routing to provider  

## 2020-05-12 NOTE — Telephone Encounter (Signed)
Was going to see provider at her facility- what happened with that?

## 2020-05-17 ENCOUNTER — Other Ambulatory Visit: Payer: Self-pay | Admitting: Family Medicine

## 2020-05-17 NOTE — Telephone Encounter (Signed)
Requested medication (s) are due for refill today: yes  Requested medication (s) are on the active medication list: yes  Last refill:  09/25/19  Future visit scheduled: no  Notes to clinic:  overdue for labs, RF not delegated to NT    Requested Prescriptions  Pending Prescriptions Disp Refills   calcitRIOL (ROCALTROL) 0.25 MCG capsule [Pharmacy Med Name: CALCITRIOL 0.25 MCG CAP] 90 capsule 1    Sig: TAKE 1 CAPSULE BY MOUTH ONCE DAILY FOR CALCIUM DEFICIENCY      Endocrinology:  Vitamins - Vitamin D Supplementation Failed - 05/17/2020 12:20 PM      Failed - 50,000 IU strengths are not delegated      Failed - Ca in normal range and within 360 days    Calcium  Date Value Ref Range Status  06/15/2018 9.7 8.7 - 10.3 mg/dL Final          Failed - Phosphate in normal range and within 360 days    No results found for: PHOS        Failed - Vitamin D in normal range and within 360 days    No results found for: [id number], [id number], [id number], [place], [place], [place], [place], 25OHVITD1, 25OHVITD2, 25OHVITD3, VD25OH        Passed - Valid encounter within last 12 months    Recent Outpatient Visits           6 months ago COVID-19   [place], [place], DO   7 months ago Benign hypertensive renal disease   [facility] [place], [name], DO   1 year ago Osteoarthritis of left hip, unspecified osteoarthritis type   [facility] [place], Jeannette How, MD   1 year ago Essential hypertension   [place], Jeannette How, MD   3 years ago Annual physical exam   [facility] [place], Jeannette How, MD

## 2020-05-19 NOTE — Telephone Encounter (Signed)
Hospice Patient 

## 2020-05-21 ENCOUNTER — Other Ambulatory Visit: Payer: Self-pay | Admitting: Family Medicine

## 2020-05-21 NOTE — Telephone Encounter (Signed)
Requested medication (s) are due for refill today: yes  Requested medication (s) are on the active medication list: yes  Last refill: 09/25/2019  #90  1 refill  Future visit scheduled no  Notes to clinic: not delegated  Requested Prescriptions  Pending Prescriptions Disp Refills   calcitRIOL (ROCALTROL) 0.25 MCG capsule [Pharmacy Med Name: CALCITRIOL 0.25 MCG CAP] 90 capsule 1    Sig: TAKE 1 CAPSULE BY MOUTH ONCE DAILY FOR CALCIUM DEFICIENCY      Endocrinology:  Vitamins - Vitamin D Supplementation Failed - 05/21/2020  4:04 PM      Failed - 50,000 IU strengths are not delegated      Failed - Ca in normal range and within 360 days    Calcium  Date Value Ref Range Status  06/15/2018 9.7 8.7 - 10.3 mg/dL Final          Failed - Phosphate in normal range and within 360 days    No results found for: PHOS        Failed - Vitamin D in normal range and within 360 days    No results found for: [id number], [id number], [id number], 25OHVITD3, [place], [place], [place], 25OHVITD1, 25OHVITD2, 25OHVITD3, VD25OH        Passed - Valid encounter within last 12 months    Recent Outpatient Visits           6 months ago COVID-19   [place], [place], DO   7 months ago Benign hypertensive renal disease   [facility] [place], [name], DO   1 year ago Osteoarthritis of left hip, unspecified osteoarthritis type   [facility] [place], Jeannette How, MD   1 year ago Essential hypertension   [place], Jeannette How, MD   3 years ago Annual physical exam   [facility] [place], Jeannette How, MD

## 2020-05-26 ENCOUNTER — Ambulatory Visit: Payer: Medicare PPO

## 2020-06-30 NOTE — Progress Notes (Signed)
This encounter was created in error - please disregard.

## 2020-07-11 ENCOUNTER — Ambulatory Visit (INDEPENDENT_AMBULATORY_CARE_PROVIDER_SITE_OTHER): Payer: Medicare PPO

## 2020-07-11 VITALS — Ht 63.0 in | Wt 151.0 lb

## 2020-07-11 DIAGNOSIS — Z0000 Encounter for general adult medical examination without abnormal findings: Secondary | ICD-10-CM

## 2020-07-11 NOTE — Progress Notes (Addendum)
I connected with Katie Saunders today by telephone and verified that I am speaking with the correct person using two identifiers. Location patient: home Location provider: work Persons participating in the virtual visit: Emilia Beck, nurse Gweneth Fritter, Glenna [place] LPN.   I discussed the limitations, risks, security and privacy concerns of performing an evaluation and management service by telephone and the availability of in person appointments. I also discussed with the patient that there may be a patient responsible charge related to this service. The patient expressed understanding and verbally consented to this telephonic visit.    Interactive audio and video telecommunications were attempted between this provider and patient, however failed, due to patient having technical difficulties OR patient did not have access to video capability.  We continued and completed visit with audio only.     Vital signs may be reported or missing.  Subjective:   Katie Saunders is a 86 y.o. female who presents for Medicare Annual (Subsequent) preventive examination.  Review of Systems     Cardiac Risk Factors include: advanced age (>32men, >46 women);hypertension;sedentary lifestyle     Objective:    Today's Vitals   07/11/20 1432  Weight: 151 lb (68.5 kg)  Height: 5\' 3"  (1.6 m)   Body mass index is 26.75 kg/m.  Advanced Directives 07/11/2020 02/08/2018 09/15/2016 04/07/2016 04/05/2016 03/24/2016  Does Patient Have a Medical Advance Directive? Yes Yes No Yes No No  Type of Advance Directive Out of facility DNR (pink MOST or yellow form) [place];Living will - [place] in Chart? - Yes - Yes - -  Would patient like information on creating a medical advance directive? - - - - No - patient declined information -    Current Medications (verified) Outpatient Encounter Medications as of 07/11/2020  Medication Sig    . acetaminophen (TYLENOL) 325 MG tablet Take 650 mg by mouth every 6 (six) hours as needed.  . calcitRIOL (ROCALTROL) 0.25 MCG capsule TAKE 1 CAPSULE BY MOUTH ONCE DAILY FOR CALCIUM DEFICIENCY  . cyanocobalamin 1000 MCG tablet Take 1,000 mcg by mouth daily.  Mar[place] Kitchen donepezil (ARICEPT) 5 MG tablet TAKE 1 TABLET BY MOUTH EACH NIGHT AT BEDTIME FOR DEMENTIA W/O BEHAVIORAL DISTURBANCE  . febuxostat (ULORIC) 40 MG tablet TAKE 1 TABLET BY MOUTH EVERY DAY FOR GOUT  . Guaifenesin 1200 MG TB12 Take by mouth.  . hydrochlorothiazide (HYDRODIURIL) 25 MG tablet TAKE 1 TABLET BY MOUTH EVERY DAY FOR BLOOD PRESSURE  . losartan (COZAAR) 100 MG tablet TAKE 1 TABLET BY MOUTH ONCE DAILY FOR BLOOD PRESSRE  . metoprolol tartrate (LOPRESSOR) 50 MG tablet TAKE 1 TABLET BY MOUTH EVERY DAY FOR BLOOD PRESSURE  . Multiple Vitamin (MULTI-VITAMINS) TABS ONCE EVERY DAY  . traZODone (DESYREL) 50 MG tablet Take 50 mg by mouth every 8 (eight) hours as needed for sleep (as needed for agitation and insomnia). Take half tab  . predniSONE (DELTASONE) 10 MG tablet 6 tabs day 1 and day 2, 5 tabs the next 2 days, decrease by 1 every other day until gone (Patient not taking: Reported on 07/11/2020)  . tiotropium (SPIRIVA HANDIHALER) 18 MCG inhalation capsule Place 1 capsule (18 mcg total) into inhaler and inhale daily. (Patient not taking: Reported on 07/11/2020)   No facility-administered encounter medications on file as of 07/11/2020.    Allergies (verified) Patient has no known allergies.   History: Past Medical History:  Diagnosis Date  . Anxiety   .  Cancer [facility])    breast Left  . Cataract   . Dementia ([place])   . Depression   . Glaucoma   . Gout   . Hyperlipidemia   . Osteoporosis   . Postmastectomy lymphedema   . Renal insufficiency    Past Surgical History:  Procedure Laterality Date  . ABDOMINAL HYSTERECTOMY    . BREAST SURGERY    . EYE SURGERY    . KNEE ARTHROPLASTY Left 04/05/2016   Procedure: COMPUTER ASSISTED  TOTAL KNEE ARTHROPLASTY;  Surgeon: Dereck Leep, MD;  Location: [facility] ORS;  Service: Orthopedics;  Laterality: Left;  . Left knee arthroscopy  09/19/2009   Dr. Margaretmary Eddy   Family History  Problem Relation Age of Onset  . Cancer Father    Social History   Socioeconomic History  . Marital status: Widowed    Spouse name: Not on file  . Number of children: Not on file  . Years of education: Not on file  . Highest education level: Not on file  Occupational History  . Not on file  Tobacco Use  . Smoking status: Never Smoker  . Smokeless tobacco: Never Used  Vaping Use  . Vaping Use: Never used  Substance and Sexual Activity  . Alcohol use: No  . Drug use: No  . Sexual activity: Not on file  Other Topics Concern  . Not on file  Social History Narrative  . Not on file   Social Determinants of Health   Financial Resource Strain: Low Risk   . Difficulty of Paying Living Expenses: Not hard at all  Food Insecurity: No Food Insecurity  . Worried About Charity fundraiser in the Last Year: Never true  . Ran Out of Food in the Last Year: Never true  Transportation Needs: No Transportation Needs  . Lack of Transportation (Medical): No  . Lack of Transportation (Non-Medical): No  Physical Activity: Inactive  . Days of Exercise per Week: 0 days  . Minutes of Exercise per Session: 0 min  Stress: No Stress Concern Present  . Feeling of Stress : Not at all  Social Connections:   . Frequency of Communication with Friends and Family: Not on file  . Frequency of Social Gatherings with Friends and Family: Not on file  . Attends Religious Services: Not on file  . Active Member of Clubs or Organizations: Not on file  . Attends Archivist Meetings: Not on file  . Marital Status: Not on file    Tobacco Counseling Counseling given: Not Answered   Clinical Intake:  Pre-visit preparation completed: Yes  Pain : No/denies pain     Nutritional Status: BMI 25 -29  Overweight Nutritional Risks: None Diabetes: No     Diabetic?no     Comments: Answers provided by nurse Gweneth Fritter. Information entered by :: NAllen LPN   Activities of Daily Living In your present state of health, do you have any difficulty performing the following activities: 07/11/2020  Hearing? N  Vision? N  Difficulty concentrating or making decisions? Y  Walking or climbing stairs? N  Dressing or bathing? Y  Doing errands, shopping? Y  Preparing Food and eating ? Y  Using the Toilet? N  In the past six months, have you accidently leaked urine? N  Do you have problems with loss of bowel control? N  Managing your Medications? Y  Managing your Finances? Y  Housekeeping or managing your Housekeeping? Y  Some recent data might be hidden  Patient Care Team: [place] as PCP - General Lollie Sails, MD (Inactive) as Consulting Physician (Gastroenterology) Bary Castilla, Forest Gleason, MD (General Surgery) Anthonette Legato, MD (Internal Medicine)  Indicate any recent Medical Services you may have received from other than [facility] providers in the past year (date may be approximate).     Assessment:   This is a routine wellness examination for Elanda.  Hearing/Vision screen  Hearing Screening   125Hz  250Hz  500Hz  1000Hz  2000Hz  3000Hz  4000Hz  6000Hz  8000Hz   Right ear:           Left ear:           Vision Screening Comments: No regular eye exams  Dietary issues and exercise activities discussed: Current Exercise Habits: The patient does not participate in regular exercise at present  Goals    . DIET - INCREASE WATER INTAKE     recommend drinking at least 6-8 glasses of water a day       Depression Screen PHQ 2/9 Scores 07/11/2020 05/23/2019 06/15/2018 02/08/2018 02/23/2017 12/01/2015  PHQ - 2 Score 0 0 0 0 0 0  PHQ- 9 Score - - 5 - - -  Exception Documentation - - (No Data) - - -    Fall Risk Fall Risk  07/11/2020 05/23/2019 06/15/2018 02/08/2018 02/23/2017   Falls in the past year? 0 1 Yes No No  Number falls in past yr: - 0 2 or more - -  Injury with Fall? - - Yes - -  Comment - - Fell on knee at table. (Completion helped by daughter) - -  Risk for fall due to : Impaired balance/gait;Medication side effect - - - -  Follow up Falls evaluation completed;Education provided;Falls prevention discussed - - - -    Any stairs in or around the home? Yes  If so, are there any without handrails? No  Home free of loose throw rugs in walkways, pet beds, electrical cords, etc? Yes  Adequate lighting in your home to reduce risk of falls? Yes   ASSISTIVE DEVICES UTILIZED TO PREVENT FALLS:  Life alert? Yes  Use of a cane, walker or w/c? Yes  Grab bars in the bathroom? Yes  Shower chair or bench in shower? Yes  Elevated toilet seat or a handicapped toilet? No   TIMED UP AND GO:  Was the test performed? No .     Cognitive Function: MMSE - Mini Mental State Exam 06/15/2018  Orientation to time 1  Orientation to Place 5  Registration 3  Attention/ Calculation 5  Recall 0  Language- name 2 objects 2  Language- repeat 1  Language- follow 3 step command 3  Language- read & follow direction 1  Write a sentence 1  Copy design 0  Copy design-comments Got 9 angles.  Total score 22     6CIT Screen 02/08/2018  What Year? 4 points  What month? 3 points  What time? 0 points  Count back from 20 0 points  Months in reverse 0 points  Repeat phrase 10 points  Total Score 17    Immunizations Immunization History  Administered Date(s) Administered  . Influenza, High Dose Seasonal PF 08/15/2019  . Influenza,inj,Quad PF,6+ Mos 08/17/2017  . Influenza-Unspecified 07/22/2014, 07/10/2015, 08/17/2016, 08/17/2018  . PFIZER SARS-COV-2 Vaccination 10/30/2019, 11/27/2019  . Pneumococcal Conjugate-13 05/20/2014  . Pneumococcal-Unspecified 01/16/1997, 07/06/2002  . Td 11/27/2014  . Zoster 03/08/2012    TDAP status: Up to date Flu Vaccine status: Up to  date Pneumococcal vaccine status:  Up to date Covid-19 vaccine status: Completed vaccines  Qualifies for Shingles Vaccine? Yes   Zostavax completed No   Shingrix Completed?: No.    Education has been provided regarding the importance of this vaccine. Patient has been advised to call insurance company to determine out of pocket expense if they have not yet received this vaccine. Advised may also receive vaccine at local pharmacy or Health Dept. Verbalized acceptance and understanding.  Screening Tests Health Maintenance  Topic Date Due  . INFLUENZA VACCINE  05/18/2020  . TETANUS/TDAP  11/27/2024  . DEXA SCAN  Completed  . COVID-19 Vaccine  Completed  . PNA vac Low Risk Adult  Completed  . COLONOSCOPY  Discontinued    Health Maintenance  Health Maintenance Due  Topic Date Due  . INFLUENZA VACCINE  05/18/2020    Colorectal cancer screening: No longer required.  Mammogram status: No longer required.  Bone Density status: Completed 03/25/2010.  Lung Cancer Screening: (Low Dose CT Chest recommended if Age 74-80 years, 30 pack-year currently smoking OR have quit w/in 15years.) does not qualify.   Lung Cancer Screening Referral: no  Additional Screening:  Hepatitis C Screening: does not qualify;   Vision Screening: Recommended annual ophthalmology exams for early detection of glaucoma and other disorders of the eye. Is the patient up to date with their annual eye exam?  No  Who is the provider or what is the name of the office in which the patient attends annual eye exams? none If pt is not established with a provider, would they like to be referred to a provider to establish care? No .   Dental Screening: Recommended annual dental exams for proper oral hygiene  Community Resource Referral / Chronic Care Management: CRR required this visit?  No   CCM required this visit?  No      Plan:     I have personally reviewed and noted the following in the patient's chart:    . Medical and social history . Use of alcohol, tobacco or illicit drugs  . Current medications and supplements . Functional ability and status . Nutritional status . Physical activity . Advanced directives . List of other physicians . Hospitalizations, surgeries, and ER visits in previous 12 months . Vitals . Screenings to include cognitive, depression, and falls . Referrals and appointments  In addition, I have reviewed and discussed with patient certain preventive protocols, quality metrics, and best practice recommendations. A written personalized care plan for preventive services as well as general preventive health recommendations were provided to patient.     Kellie Simmering, LPN   [id number]/[id number]   Nurse Notes:  All answers provided by her nurse Gweneth Fritter. 6 CIT not performed due to diagnosis of dementia.

## 2020-07-11 NOTE — Patient Instructions (Signed)
[name] , Thank you for taking time to come for your Medicare Wellness Visit. I appreciate your ongoing commitment to your health goals. Please review the following plan we discussed and let me know if I can assist you in the future.   Screening recommendations/referrals: Colonoscopy: not required Mammogram: not required Bone Density: completed 03/25/2010 Recommended yearly ophthalmology/optometry visit for glaucoma screening and checkup Recommended yearly dental visit for hygiene and checkup  Vaccinations: Influenza vaccine: due Pneumococcal vaccine: completed 05/20/2014 Tdap vaccine: completed 11/27/2014 Shingles vaccine: not completed   Covid-19: 10/30/2019, 11/27/2019  Advanced directives: has a DNR  Conditions/risks identified: none  Next appointment: Follow up in one year for your annual wellness visit    Preventive Care 31 Years and Older, Female Preventive care refers to lifestyle choices and visits with your health care provider that can promote health and wellness. What does preventive care include?  A yearly physical exam. This is also called an annual well check.  Dental exams once or twice a year.  Routine eye exams. Ask your health care provider how often you should have your eyes checked.  Personal lifestyle choices, including:  Daily care of your teeth and gums.  Regular physical activity.  Eating a healthy diet.  Avoiding tobacco and drug use.  Limiting alcohol use.  Practicing safe sex.  Taking low-dose aspirin every day.  Taking vitamin and mineral supplements as recommended by your health care provider. What happens during an annual well check? The services and screenings done by your health care provider during your annual well check will depend on your age, overall health, lifestyle risk factors, and family history of disease. Counseling  Your health care provider may ask you questions about your:  Alcohol use.  Tobacco use.  Drug  use.  Emotional well-being.  Home and relationship well-being.  Sexual activity.  Eating habits.  History of falls.  Memory and ability to understand (cognition).  Work and work Statistician.  Reproductive health. Screening  You may have the following tests or measurements:  Height, weight, and BMI.  Blood pressure.  Lipid and cholesterol levels. These may be checked every 5 years, or more frequently if you are over 34 years old.  Skin check.  Lung cancer screening. You may have this screening every year starting at age 17 if you have a 30-pack-year history of smoking and currently smoke or have quit within the past 15 years.  Fecal occult blood test (FOBT) of the stool. You may have this test every year starting at age 62.  Flexible sigmoidoscopy or colonoscopy. You may have a sigmoidoscopy every 5 years or a colonoscopy every 10 years starting at age 43.  Hepatitis C blood test.  Hepatitis B blood test.  Sexually transmitted disease (STD) testing.  Diabetes screening. This is done by checking your blood sugar (glucose) after you have not eaten for a while (fasting). You may have this done every 1-3 years.  Bone density scan. This is done to screen for osteoporosis. You may have this done starting at age 23.  Mammogram. This may be done every 1-2 years. Talk to your health care provider about how often you should have regular mammograms. Talk with your health care provider about your test results, treatment options, and if necessary, the need for more tests. Vaccines  Your health care provider may recommend certain vaccines, such as:  Influenza vaccine. This is recommended every year.  Tetanus, diphtheria, and acellular pertussis (Tdap, Td) vaccine. You may need a Td  booster every 10 years.  Zoster vaccine. You may need this after age 4.  Pneumococcal 13-valent conjugate (PCV13) vaccine. One dose is recommended after age 66.  Pneumococcal polysaccharide  (PPSV23) vaccine. One dose is recommended after age 17. Talk to your health care provider about which screenings and vaccines you need and how often you need them. This information is not intended to replace advice given to you by your health care provider. Make sure you discuss any questions you have with your health care provider. Document Released: 10/31/2015 Document Revised: 06/23/2016 Document Reviewed: 08/05/2015 Elsevier Interactive Patient Education  2017 [place] Prevention in the Home Falls can cause injuries. They can happen to people of all ages. There are many things you can do to make your home safe and to help prevent falls. What can I do on the outside of my home?  Regularly fix the edges of walkways and driveways and fix any cracks.  Remove anything that might make you trip as you walk through a door, such as a raised step or threshold.  Trim any bushes or trees on the path to your home.  Use bright outdoor lighting.  Clear any walking paths of anything that might make someone trip, such as rocks or tools.  Regularly check to see if handrails are loose or broken. Make sure that both sides of any steps have handrails.  Any raised decks and porches should have guardrails on the edges.  Have any leaves, snow, or ice cleared regularly.  Use sand or salt on walking paths during winter.  Clean up any spills in your garage right away. This includes oil or grease spills. What can I do in the bathroom?  Use night lights.  Install grab bars by the toilet and in the tub and shower. Do not use towel bars as grab bars.  Use non-skid mats or decals in the tub or shower.  If you need to sit down in the shower, use a plastic, non-slip stool.  Keep the floor dry. Clean up any water that spills on the floor as soon as it happens.  Remove soap buildup in the tub or shower regularly.  Attach bath mats securely with double-sided non-slip rug tape.  Do not have  throw rugs and other things on the floor that can make you trip. What can I do in the bedroom?  Use night lights.  Make sure that you have a light by your bed that is easy to reach.  Do not use any sheets or blankets that are too big for your bed. They should not hang down onto the floor.  Have a firm chair that has side arms. You can use this for support while you get dressed.  Do not have throw rugs and other things on the floor that can make you trip. What can I do in the kitchen?  Clean up any spills right away.  Avoid walking on wet floors.  Keep items that you use a lot in easy-to-reach places.  If you need to reach something above you, use a strong step stool that has a grab bar.  Keep electrical cords out of the way.  Do not use floor polish or wax that makes floors slippery. If you must use wax, use non-skid floor wax.  Do not have throw rugs and other things on the floor that can make you trip. What can I do with my stairs?  Do not leave any items on the stairs.  Make sure that there are handrails on both sides of the stairs and use them. Fix handrails that are broken or loose. Make sure that handrails are as long as the stairways.  Check any carpeting to make sure that it is firmly attached to the stairs. Fix any carpet that is loose or worn.  Avoid having throw rugs at the top or bottom of the stairs. If you do have throw rugs, attach them to the floor with carpet tape.  Make sure that you have a light switch at the top of the stairs and the bottom of the stairs. If you do not have them, ask someone to add them for you. What else can I do to help prevent falls?  Wear shoes that:  Do not have high heels.  Have rubber bottoms.  Are comfortable and fit you well.  Are closed at the toe. Do not wear sandals.  If you use a stepladder:  Make sure that it is fully opened. Do not climb a closed stepladder.  Make sure that both sides of the stepladder are  locked into place.  Ask someone to hold it for you, if possible.  Clearly mark and make sure that you can see:  Any grab bars or handrails.  First and last steps.  Where the edge of each step is.  Use tools that help you move around (mobility aids) if they are needed. These include:  Canes.  Walkers.  Scooters.  Crutches.  Turn on the lights when you go into a dark area. Replace any light bulbs as soon as they burn out.  Set up your furniture so you have a clear path. Avoid moving your furniture around.  If any of your floors are uneven, fix them.  If there are any pets around you, be aware of where they are.  Review your medicines with your doctor. Some medicines can make you feel dizzy. This can increase your chance of falling. Ask your doctor what other things that you can do to help prevent falls. This information is not intended to replace advice given to you by your health care provider. Make sure you discuss any questions you have with your health care provider. Document Released: 07/31/2009 Document Revised: 03/11/2016 Document Reviewed: 11/08/2014 Elsevier Interactive Patient Education  2017 [employer].

## 2020-07-15 ENCOUNTER — Other Ambulatory Visit: Payer: Self-pay | Admitting: Family Medicine

## 2020-07-15 DIAGNOSIS — I10 Essential (primary) hypertension: Secondary | ICD-10-CM

## 2020-07-15 NOTE — Telephone Encounter (Signed)
Requested Prescriptions  Pending Prescriptions Disp Refills  . metoprolol tartrate (LOPRESSOR) 50 MG tablet [Pharmacy Med Name: METOPROLOL TARTRATE 50 MG TAB] 90 tablet 1    Sig: TAKE 1 TABLET BY MOUTH ONCE DAILY FOR BLOOD PRESSURE     Cardiovascular:  Beta Blockers Passed - 07/15/2020  9:10 AM      Passed - Last BP in normal range    BP Readings from Last 1 Encounters:  09/25/19 139/79         Passed - Last Heart Rate in normal range    Pulse Readings from Last 1 Encounters:  05/23/19 73         Passed - Valid encounter within last 6 months    Recent Outpatient Visits          8 months ago [place], [place], DO   9 months ago Benign hypertensive renal disease   [facility] [place], [name], DO   1 year ago Osteoarthritis of left hip, unspecified osteoarthritis type   [facility] [place], Jeannette How, MD   2 years ago Essential hypertension   [place], Jeannette How, MD   3 years ago Annual physical exam   [place], Jeannette How, MD      Future Appointments            In 43 months [place], [place]

## 2020-10-06 ENCOUNTER — Other Ambulatory Visit: Payer: Self-pay | Admitting: Family Medicine

## 2020-10-06 DIAGNOSIS — M10372 Gout due to renal impairment, left ankle and foot: Secondary | ICD-10-CM

## 2020-10-06 NOTE — Telephone Encounter (Signed)
Requested medication (s) are due for refill today: Yes  Requested medication (s) are on the active medication list: Yes  Last refill:  09/25/19 and 04/19/19  Future visit scheduled: Yes  Notes to clinic:  Unable to refill per protocol, failed lab (uric acid); expired Rx, cannot delegate     Requested Prescriptions  Pending Prescriptions Disp Refills   febuxostat (ULORIC) 40 MG tablet [Pharmacy Med Name: FEBUXOSTAT 40 MG TAB] 90 tablet 0    Sig: TAKE 1 TABLET BY [place] GOUT      Endocrinology: Gout Agents - febuxostat & probenecid Failed - 10/06/2020 11:34 AM      Failed - Uric Acid in normal range and within 360 days    Uric Acid  Date Value Ref Range Status  06/15/2018 4.4 2.5 - 7.1 mg/dL Final    Comment:               Therapeutic target for gout patients: <6.0          Passed - Valid encounter within last 12 months    Recent Outpatient Visits           10 months ago [place], [place], DO   1 year ago Benign hypertensive renal disease   [facility] [place], [name], DO   1 year ago Osteoarthritis of left hip, unspecified osteoarthritis type   [facility] [place], Jeannette How, MD   2 years ago Essential hypertension   [place], Jeannette How, MD   3 years ago Annual physical exam   [place] [facility], Jeannette How, MD       Future Appointments             In 9 months [place], [facility]                Multiple Vitamin (MULTI-VITAMINS) TABS [Pharmacy Med Name: MULTI-VITAMINS TAB] 30 tablet 12    Sig: TAKE 1 TABLET BY MOUTH DAILY      There is no refill protocol information for this order      calcitRIOL (ROCALTROL) 0.25 MCG capsule [Pharmacy Med Name: CALCITRIOL 0.25 MCG CAP] 90 capsule 1    Sig: TAKE 1 CAPSULE BY MOUTH ONCE DAILY FOR CALCIUM DEFICIENCY      Endocrinology:  Vitamins - Vitamin D Supplementation Failed - 10/06/2020 11:34 AM       Failed - 50,000 IU strengths are not delegated      Failed - Ca in normal range and within 360 days    Calcium  Date Value Ref Range Status  06/15/2018 9.7 8.7 - 10.3 mg/dL Final          Failed - Phosphate in normal range and within 360 days    No results found for: PHOS        Failed - Vitamin D in normal range and within 360 days    No results found for: [id number], [id number], [id number], 25OHVITD3, 25OHVITD2, 25OHVITD3, 25OHVITD2, 25OHVITD1, 25OHVITD2, 25OHVITD3, VD25OH        Passed - Valid encounter within last 12 months    Recent Outpatient Visits           10 months ago COVID-19   [place], [place], DO   1 year ago Benign hypertensive renal disease   [facility] [place], [name], DO   1 year ago Osteoarthritis of left hip, unspecified osteoarthritis type  [facility] [place], Jeannette How, MD   2 years ago Essential hypertension   [place], Jeannette How, MD   3 years ago Annual physical exam   [facility] [place], Jeannette How, MD       Future Appointments             In 9 months [place], [place]

## 2021-03-27 ENCOUNTER — Telehealth: Payer: Medicare PPO

## 2021-03-27 NOTE — Telephone Encounter (Signed)
Received forms from [place] to be signed by Dr.Eschete. Patient refused to see provider due to her not knowing her. Called and left message for R.N. Rogelia Boga. Stating Dr. [name] is not her supervising provider.

## 2021-04-29 ENCOUNTER — Emergency Department

## 2021-04-29 ENCOUNTER — Emergency Department
Admission: EM | Admit: 2021-04-29 | Discharge: 2021-04-30 | DRG: 951 | Disposition: A | Discharge status: Hospice | Source: Skilled Nursing Facility | Attending: Internal Medicine | Admitting: Internal Medicine

## 2021-04-29 DIAGNOSIS — F419 Anxiety disorder, unspecified: Secondary | ICD-10-CM | POA: Diagnosis present

## 2021-04-29 DIAGNOSIS — D631 Anemia in chronic kidney disease: Secondary | ICD-10-CM | POA: Diagnosis present

## 2021-04-29 DIAGNOSIS — D509 Iron deficiency anemia, unspecified: Secondary | ICD-10-CM | POA: Diagnosis not present

## 2021-04-29 DIAGNOSIS — F0390 Unspecified dementia without behavioral disturbance: Secondary | ICD-10-CM | POA: Diagnosis present

## 2021-04-29 DIAGNOSIS — N184 Chronic kidney disease, stage 4 (severe): Secondary | ICD-10-CM | POA: Diagnosis present

## 2021-04-29 DIAGNOSIS — G301 Alzheimer's disease with late onset: Secondary | ICD-10-CM | POA: Diagnosis not present

## 2021-04-29 DIAGNOSIS — R627 Adult failure to thrive: Secondary | ICD-10-CM | POA: Diagnosis present

## 2021-04-29 DIAGNOSIS — Z9114 Patient's other noncompliance with medication regimen: Secondary | ICD-10-CM | POA: Diagnosis not present

## 2021-04-29 DIAGNOSIS — Z66 Do not resuscitate: Secondary | ICD-10-CM | POA: Diagnosis not present

## 2021-04-29 DIAGNOSIS — Z90710 Acquired absence of both cervix and uterus: Secondary | ICD-10-CM

## 2021-04-29 DIAGNOSIS — W19XXXA Unspecified fall, initial encounter: Secondary | ICD-10-CM | POA: Diagnosis not present

## 2021-04-29 DIAGNOSIS — G9341 Metabolic encephalopathy: Secondary | ICD-10-CM | POA: Diagnosis present

## 2021-04-29 DIAGNOSIS — Q613 Polycystic kidney, unspecified: Secondary | ICD-10-CM

## 2021-04-29 DIAGNOSIS — M109 Gout, unspecified: Secondary | ICD-10-CM | POA: Diagnosis not present

## 2021-04-29 DIAGNOSIS — Z96652 Presence of left artificial knee joint: Secondary | ICD-10-CM | POA: Diagnosis not present

## 2021-04-29 DIAGNOSIS — E785 Hyperlipidemia, unspecified: Secondary | ICD-10-CM | POA: Diagnosis present

## 2021-04-29 DIAGNOSIS — I129 Hypertensive chronic kidney disease with stage 1 through stage 4 chronic kidney disease, or unspecified chronic kidney disease: Secondary | ICD-10-CM | POA: Diagnosis present

## 2021-04-29 DIAGNOSIS — Z20822 Contact with and (suspected) exposure to covid-19: Secondary | ICD-10-CM | POA: Diagnosis not present

## 2021-04-29 DIAGNOSIS — Z79899 Other long term (current) drug therapy: Secondary | ICD-10-CM | POA: Diagnosis not present

## 2021-04-29 DIAGNOSIS — Z853 Personal history of malignant neoplasm of breast: Secondary | ICD-10-CM | POA: Diagnosis not present

## 2021-04-29 DIAGNOSIS — R319 Hematuria, unspecified: Secondary | ICD-10-CM

## 2021-04-29 DIAGNOSIS — I890 Lymphedema, not elsewhere classified: Secondary | ICD-10-CM

## 2021-04-29 DIAGNOSIS — N2581 Secondary hyperparathyroidism of renal origin: Secondary | ICD-10-CM | POA: Diagnosis not present

## 2021-04-29 DIAGNOSIS — M810 Age-related osteoporosis without current pathological fracture: Secondary | ICD-10-CM | POA: Diagnosis present

## 2021-04-29 DIAGNOSIS — F0280 Dementia in other diseases classified elsewhere without behavioral disturbance: Secondary | ICD-10-CM

## 2021-04-29 DIAGNOSIS — N3001 Acute cystitis with hematuria: Secondary | ICD-10-CM | POA: Diagnosis not present

## 2021-04-29 DIAGNOSIS — G9340 Encephalopathy, unspecified: Secondary | ICD-10-CM

## 2021-04-29 DIAGNOSIS — Z515 Encounter for palliative care: Secondary | ICD-10-CM

## 2021-04-29 DIAGNOSIS — N390 Urinary tract infection, site not specified: Secondary | ICD-10-CM | POA: Diagnosis present

## 2021-04-29 DIAGNOSIS — R4182 Altered mental status, unspecified: Secondary | ICD-10-CM

## 2021-04-29 DIAGNOSIS — H409 Unspecified glaucoma: Secondary | ICD-10-CM | POA: Diagnosis present

## 2021-04-29 DIAGNOSIS — Z7189 Other specified counseling: Secondary | ICD-10-CM

## 2021-04-29 LAB — CBC WITH DIFFERENTIAL/PLATELET
Abs Immature Granulocytes: 0.02 10*3/uL (ref 0.00–0.07)
Basophils Absolute: 0 10*3/uL (ref 0.0–0.1)
Basophils Relative: 0 %
Eosinophils Absolute: 0.1 10*3/uL (ref 0.0–0.5)
Eosinophils Relative: 1 %
HCT: 36.1 % (ref 36.0–46.0)
Hemoglobin: 11.9 g/dL — ABNORMAL LOW (ref 12.0–15.0)
Immature Granulocytes: 0 %
Lymphocytes Relative: 24 %
Lymphs Abs: 1.6 10*3/uL (ref 0.7–4.0)
MCH: 29.2 pg (ref 26.0–34.0)
MCHC: 33 g/dL (ref 30.0–36.0)
MCV: 88.7 fL (ref 80.0–100.0)
Monocytes Absolute: 0.5 10*3/uL (ref 0.1–1.0)
Monocytes Relative: 7 %
Neutro Abs: 4.7 10*3/uL (ref 1.7–7.7)
Neutrophils Relative %: 68 %
Platelets: 256 10*3/uL (ref 150–400)
RBC: 4.07 MIL/uL (ref 3.87–5.11)
RDW: 13.1 % (ref 11.5–15.5)
WBC: 6.9 10*3/uL (ref 4.0–10.5)
nRBC: 0 % (ref 0.0–0.2)

## 2021-04-29 LAB — URINALYSIS, COMPLETE (UACMP) WITH MICROSCOPIC
Bilirubin Urine: NEGATIVE
Glucose, UA: NEGATIVE mg/dL
Ketones, ur: 5 mg/dL — AB
Nitrite: NEGATIVE
Protein, ur: 100 mg/dL — AB
Specific Gravity, Urine: 1.017 (ref 1.005–1.030)
Squamous Epithelial / HPF: NONE SEEN (ref 0–5)
WBC, UA: >50 WBC/hpf — ABNORMAL HIGH (ref 0–5)
pH: 7 (ref 5.0–8.0)

## 2021-04-29 LAB — COMPREHENSIVE METABOLIC PANEL
ALT: 13 U/L (ref 0–44)
AST: 21 U/L (ref 15–41)
Albumin: 3.8 g/dL (ref 3.5–5.0)
Alkaline Phosphatase: 88 U/L (ref 38–126)
Anion gap: 10 (ref 5–15)
BUN: 31 mg/dL — ABNORMAL HIGH (ref 8–23)
CO2: 25 mmol/L (ref 22–32)
Calcium: 9.9 mg/dL (ref 8.9–10.3)
Chloride: 106 mmol/L (ref 98–111)
Creatinine, Ser: 1.63 mg/dL — ABNORMAL HIGH (ref 0.44–1.00)
GFR, Estimated: 30 mL/min — ABNORMAL LOW (ref >60)
Glucose, Bld: 105 mg/dL — ABNORMAL HIGH (ref 70–99)
Potassium: 4.4 mmol/L (ref 3.5–5.1)
Sodium: 141 mmol/L (ref 135–145)
Total Bilirubin: 1.3 mg/dL — ABNORMAL HIGH (ref 0.3–1.2)
Total Protein: 7.4 g/dL (ref 6.5–8.1)

## 2021-04-29 LAB — RESP PANEL BY RT-PCR (FLU A&B, COVID) ARPGX2
Influenza A by PCR: NEGATIVE
Influenza B by PCR: NEGATIVE
SARS Coronavirus 2 by RT PCR: NEGATIVE

## 2021-04-29 LAB — LACTIC ACID, PLASMA: Lactic Acid, Venous: 2.2 mmol/L (ref 0.5–1.9)

## 2021-04-29 LAB — PROTIME-INR
INR: 1 (ref 0.8–1.2)
Prothrombin Time: 13.5 seconds (ref 11.4–15.2)

## 2021-04-29 LAB — PROCALCITONIN: Procalcitonin: <0.1 ng/mL

## 2021-04-29 LAB — TSH: TSH: 1.463 u[IU]/mL (ref 0.350–4.500)

## 2021-04-29 LAB — MAGNESIUM: Magnesium: 2 mg/dL (ref 1.7–2.4)

## 2021-04-29 LAB — APTT: aPTT: 33 seconds (ref 24–36)

## 2021-04-29 MED ORDER — HALOPERIDOL LACTATE 2 MG/ML PO CONC
0.5000 mg | ORAL | Status: DC | PRN
  Filled 2021-04-29: qty 0.3

## 2021-04-29 MED ORDER — LORAZEPAM 2 MG/ML IJ SOLN: 1.0000 mg | INTRAMUSCULAR | Status: DC | PRN

## 2021-04-29 MED ORDER — HALOPERIDOL LACTATE 5 MG/ML IJ SOLN: 0.5000 mg | INTRAMUSCULAR | Status: DC | PRN

## 2021-04-29 MED ORDER — LORAZEPAM 2 MG/ML PO CONC: 1.0000 mg | ORAL | Status: DC | PRN

## 2021-04-29 MED ORDER — BACLOFEN 10 MG PO TABS
5.0000 mg | ORAL_TABLET | Freq: Two times a day (BID) | ORAL | Status: DC | PRN
  Filled 2021-04-29: qty 0.5

## 2021-04-29 MED ORDER — HALOPERIDOL LACTATE 5 MG/ML IJ SOLN
2.0000 mg | Freq: Once | INTRAMUSCULAR | Status: AC
  Administered 2021-04-29: 2 mg via INTRAMUSCULAR
  Filled 2021-04-29: qty 1

## 2021-04-29 MED ORDER — SODIUM CHLORIDE 0.9 % IV SOLN
1.0000 g | Freq: Once | INTRAVENOUS | Status: AC
  Administered 2021-04-29: 1 g via INTRAVENOUS
  Filled 2021-04-29: qty 10

## 2021-04-29 MED ORDER — ACETAMINOPHEN 650 MG RE SUPP: 650.0000 mg | Freq: Four times a day (QID) | RECTAL | Status: DC | PRN

## 2021-04-29 MED ORDER — BIOTENE DRY MOUTH MT LIQD
15.0000 mL | OROMUCOSAL | Status: DC | PRN
  Filled 2021-04-29: qty 15

## 2021-04-29 MED ORDER — HALOPERIDOL 0.5 MG PO TABS
0.5000 mg | ORAL_TABLET | ORAL | Status: DC | PRN
  Filled 2021-04-29: qty 1

## 2021-04-29 MED ORDER — MORPHINE SULFATE (PF) 2 MG/ML IV SOLN: 1.0000 mg | INTRAVENOUS | Status: DC | PRN

## 2021-04-29 MED ORDER — ACETAMINOPHEN 325 MG PO TABS: 650.0000 mg | ORAL_TABLET | Freq: Four times a day (QID) | ORAL | Status: DC | PRN

## 2021-04-29 MED ORDER — LORAZEPAM 1 MG PO TABS: 1.0000 mg | ORAL_TABLET | ORAL | Status: DC | PRN

## 2021-04-29 MED ORDER — LACTATED RINGERS IV SOLN: INTRAVENOUS | Status: DC

## 2021-04-29 MED ORDER — BIOTENE DRY MOUTH MT LIQD: 15.0000 mL | OROMUCOSAL | Status: DC | PRN

## 2021-04-29 MED ORDER — POLYVINYL ALCOHOL 1.4 % OP SOLN
1.0000 [drp] | Freq: Four times a day (QID) | OPHTHALMIC | Status: DC | PRN
  Filled 2021-04-29: qty 15

## 2021-04-29 NOTE — ED Notes (Signed)
This RN remains unable to obtain VS d/t pt agitation. MD aware.

## 2021-04-29 NOTE — Sepsis Progress Note (Signed)
Notified bedside nurse of need to draw repeat lactic acid. 

## 2021-04-29 NOTE — H&P (Signed)
History and Physical   Katie Saunders FMB:[id number] DOB: 1930-12-17 DOA: 04/29/2021  PCP: [name], [facility]   Patient coming from: [place] [facility] [place]  Chief Complaint: Abnormal vitals, UTI, combative, weakness, confusion  HPI: Katie Saunders is a 87 y.o. female with medical history significant of hypertension, breast cancer, CKD 4, dementia, glaucoma, gout, hyperlipidemia, anemia, lymphedema, anxiety presenting with ongoing UTI and worsening combativeness, weakness, confusion.  History obtained with assistance of chart review and family due to patient's confusion.  Patient presents from [facility] [place].  Of note she follows with hospice through a floor care.  She has had abnormal vitals with lower blood pressures and tachycardia recently.  Last couple days she also has had weakness, confusion has been combative.  She was noted to be combative in the ED initially as well.  She has been dealing with a UTI for at least 2 weeks and she has been nonadherent to her medications.  EDP began work-up in ED and daughter later arrived and upon further discussion with her being a hospice patient decision was made to transition patient to comfort care with no interventions to prolong her life.  EDP spoke with patient's hospice provider Katie Saunders care who states her current facility does not have the ability to provide the care she will need an hour working to arrange another facility for hospice care.  ED Course: Vital signs in the ED ranging from the 701X to 793J systolic blood pressure, heart rate in the 110s.  Lab work-up showed CMP with creatinine stable 1.63, T bili 1.3.  CBC with hemoglobin stable 11.9.  PT PTT and INR within normal limits.  Lactic acid initially elevated to 2.2.  TSH normal.  Urinalysis consistent with UTI.  Urine culture blood cultures and procalcitonin pending.  Respiratory panel for flu and COVID pending.  Chest x-ray with no acute abnormality.  Pelvis x-ray with  no acute abnormality.  Patient initially given dose of ceftriaxone and this has been stopped after discussion with family.  Has been started on comfort care orders including Haldol and morphine as needed.  Review of Systems: Unable to be performed due to patient's altered mental status.  Past Medical History:  Diagnosis Date   Anxiety    Cancer ([place])    breast Left   Cataract    Dementia ([place])    Depression    Glaucoma    Gout    Hyperlipidemia    Osteoporosis    Postmastectomy lymphedema    Renal insufficiency     Past Surgical History:  Procedure Laterality Date   ABDOMINAL HYSTERECTOMY     BREAST SURGERY     EYE SURGERY     KNEE ARTHROPLASTY Left 04/05/2016   Procedure: COMPUTER ASSISTED TOTAL KNEE ARTHROPLASTY;  Surgeon: Katie Leep, MD;  Location: [facility] ORS;  Service: Orthopedics;  Laterality: Left;   Left knee arthroscopy  09/19/2009   Dr. Margaretmary Saunders    Social History  reports that she has never smoked. She has never used smokeless tobacco. She reports that she does not drink alcohol and does not use drugs.  No Known Allergies  Family History  Problem Relation Age of Onset   Cancer Father   Reviewed on admssion  Prior to Katie Saunders medications   Medication Sig Start Date End Date Taking? Authorizing Provider  acetaminophen (TYLENOL) 325 MG tablet Take 650 mg by mouth every 6 (six) hours as needed.    [provider]  calcitRIOL (ROCALTROL)  0.25 MCG capsule TAKE 1 CAPSULE BY MOUTH ONCE DAILY FOR CALCIUM DEFICIENCY 09/25/19   Katie Saunders, Katie P, DO  cyanocobalamin 1000 MCG tablet Take 1,000 mcg by mouth daily.    [provider]  donepezil (ARICEPT) 5 MG tablet TAKE 1 TABLET BY MOUTH EACH NIGHT AT BEDTIME FOR DEMENTIA W/O BEHAVIORAL DISTURBANCE 09/25/19   Katie Saunders, Katie P, DO  febuxostat (ULORIC) 40 MG tablet TAKE 1 TABLET BY MOUTH EVERY DAY FOR GOUT 02/18/20   Katie Saunders, Katie P, DO  Guaifenesin 1200 MG TB12 Take by mouth. 11/16/18   [provider]  hydrochlorothiazide (HYDRODIURIL) 25 MG tablet TAKE 1 TABLET BY MOUTH EVERY DAY FOR BLOOD PRESSURE 09/25/19   Katie Saunders, Katie P, DO  losartan (COZAAR) 100 MG tablet TAKE 1 TABLET BY MOUTH ONCE DAILY FOR BLOOD PRESSRE 09/25/19   Katie Saunders, Katie P, DO  metoprolol tartrate (LOPRESSOR) 50 MG tablet TAKE 1 TABLET BY MOUTH ONCE DAILY FOR BLOOD PRESSURE 07/15/20   Katie Liter P, DO  Multiple Vitamin (MULTI-VITAMINS) TABS ONCE EVERY DAY 04/19/19   Katie Saunders, Katie P, DO  predniSONE (DELTASONE) 10 MG tablet 6 tabs day 1 and day 2, 5 tabs the next 2 days, decrease by 1 every other day until gone Patient not taking: Reported on 07/11/2020 11/15/19   Katie Liter P, DO  tiotropium (SPIRIVA HANDIHALER) 18 MCG inhalation capsule Place 1 capsule (18 mcg total) into inhaler and inhale daily. Patient not taking: Reported on 07/11/2020 09/25/19   Katie Liter P, DO  traZODone (DESYREL) 50 MG tablet Take 50 mg by mouth every 8 (eight) hours as needed for sleep (as needed for agitation and insomnia). Take half tab    [provider]    Physical Exam: Vitals:   04/29/21 1600  SpO2: 95%   Physical Exam Constitutional:      General: She is not in acute distress.    Comments: Drowsy when seen, will open eyes to voice but does not respond to questions.  HENT:     Head: Normocephalic and atraumatic.     Mouth/Throat:     Mouth: Mucous membranes are moist.     Pharynx: Oropharynx is clear.  Eyes:     Extraocular Movements: Extraocular movements intact.     Pupils: Pupils are equal, round, and reactive to light.  Cardiovascular:     Rate and Rhythm: Normal rate and regular rhythm.     Pulses: Normal pulses.     Heart sounds: Normal heart sounds.  Pulmonary:     Effort: Pulmonary effort is normal. No respiratory distress.     Breath sounds: Normal breath sounds.  Abdominal:     General: Bowel sounds are normal. There is no distension.     Palpations: Abdomen is soft.     Tenderness: There is no  abdominal tenderness.  Musculoskeletal:        General: No swelling or deformity.  Skin:    General: Skin is warm and dry.  Neurological:     Comments: Drowsy when seen, will open eyes to voice but does not respond to questions.   Labs on Katie Saunders: I have personally reviewed following labs and imaging studies  CBC: Recent Labs  Lab 04/29/21 1629  WBC 6.9  NEUTROABS 4.7  HGB 11.9*  HCT 36.1  MCV 88.7  PLT 017    Basic Metabolic Panel: Recent Labs  Lab 04/29/21 1629  NA 141  K 4.4  CL 106  CO2 25  GLUCOSE 105*  BUN 31*  CREATININE 1.63*  CALCIUM 9.9  MG 2.0    GFR: CrCl cannot be calculated (Unknown ideal weight.).  Liver Function Tests: Recent Labs  Lab 04/29/21 1629  AST 21  ALT 13  ALKPHOS 88  BILITOT 1.3*  PROT 7.4  ALBUMIN 3.8    Urine analysis:    Component Value Date/Time   COLORURINE YELLOW (A) 04/29/2021 1629   APPEARANCEUR CLOUDY (A) 04/29/2021 1629   APPEARANCEUR Hazy (A) 06/15/2018 1434   LABSPEC 1.017 04/29/2021 1629   PHURINE 7.0 04/29/2021 1629   GLUCOSEU NEGATIVE 04/29/2021 1629   HGBUR SMALL (A) 04/29/2021 1629   BILIRUBINUR NEGATIVE 04/29/2021 1629   BILIRUBINUR Negative 06/15/2018 1434   KETONESUR 5 (A) 04/29/2021 1629   PROTEINUR 100 (A) 04/29/2021 1629   NITRITE NEGATIVE 04/29/2021 1629   LEUKOCYTESUR LARGE (A) 04/29/2021 1629    Radiological Exams on Katie Saunders: DG Pelvis Portable  Result Date: 04/29/2021 CLINICAL DATA:  Fall.  Urinary tract infection for 2 weeks. EXAM: PORTABLE PELVIS 1-2 VIEWS COMPARISON:  CT 03/01/2013 FINDINGS: Single oblique view of the pelvis. Femoral heads are located. No gross fracture. Joint spaces are maintained for age. IMPRESSION: No acute findings, given limitation of mild obliquity.  For Electronically Signed   By: Abigail Miyamoto M.D.   On: 04/29/2021 16:50   DG Chest Port 1 View  Result Date: 04/29/2021 CLINICAL DATA:  Sepsis. EXAM: PORTABLE CHEST 1 VIEW COMPARISON:  None. FINDINGS: Normal  heart size. No pleural effusion or edema. No airspace opacities identified. Remote healed right posterior rib fracture deformities identified. The visualized osseous structures are unremarkable. IMPRESSION: No acute cardiopulmonary abnormalities. Electronically Signed   By: Kerby Moors M.D.   On: 04/29/2021 16:52    EKG: Not performed in ED  Assessment/Plan Principal Problem:   UTI (urinary tract infection) Active Problems:   Hyperlipidemia   CKD stage 4 secondary to hypertension (HCC)   Gout   Dementia (HCC)   Benign hypertensive renal disease   Iron deficiency anemia   Lymphedema   Encephalopathy   DNR (do not resuscitate)   UTI Encephalopathy Hospice patient/comfort care > Patient with ongoing UTI at facility with nonadherence to medications.  Had become combative weak and confused in the past couple days.  Concerns for developing sepsis in the ED initially and then on further discussion with family decision made to stop any life prolonging treatments and focus on comfort care. > Does follow with hospice outpatient with your care.  EDP spoke to them and they are working to arrange facility for her to receive hospice care as her current one does not meet criteria needed. - Antibiotics discontinued, comfort care orders initiated. - As needed Haldol for agitation - As needed Ativan for anxiety - As needed morphine for pain - As needed baclofen for hiccups - IV fluids - As needed eyedrops and oral rinses  Hypertension CKD 4 Coma Gout Hyperlipidemia Anemia Lymphedema - Holding all home medications in the setting of comfort care only, if medications need to be added back for comfort that would certainly be reasonable.  DVT prophylaxis: None, comfort care  Code Status:   DNR, comfort care  Family Communication:  Discussed with daughter, reconfirmed comfort care measures. Disposition Plan:   Patient is from:  [place]  Anticipated DC to:  Pending hospice  availability  Anticipated DC date:  1 to 3 days  Anticipated DC barriers: None  Consults called:  None  Katie Saunders status:  Observation, MedSurg  Severity of Illness:  The appropriate patient status for this patient is OBSERVATION. Observation status is judged to be reasonable and necessary in order to provide the required intensity of service to ensure the patient's safety. The patient's presenting symptoms, physical exam findings, and initial radiographic and laboratory data in the context of their medical condition is felt to place them at decreased risk for further clinical deterioration. Furthermore, it is anticipated that the patient will be medically stable for discharge from the hospital within 2 midnights of Katie Saunders. The following factors support the patient status of observation.   " The patient's presenting symptoms include confusion, combativeness, weakness, tachycardia, hypotension. " The physical exam findings include confusion, is alert to voice but not responsive. " The initial radiographic and laboratory data are stable initial findings as per HPI other than urinalysis consistent with UTI with leukocytes, white cells, bacteria.  Lactic acid 2.2.  Hemoglobin stable 11.9, creatinine 1.63 which is stable.  Marcelyn Bruins MD [facility]  How to contact the [facility] Attending or Consulting provider [place] or covering provider during after hours [place], for this patient?   Check the care team in [facility] and look for a) attending/consulting [facility] provider listed and b) the [facility] team listed Log into [email or web address] and use [place]'s universal password to access. If you do not have the password, please contact the hospital operator. Locate the [facility] provider you are looking for under [facility] and page to a number that you can be directly reached. If you still have difficulty reaching the provider, please page the [facility] (Director on Call) for the Hospitalists listed on amion for  assistance.  04/29/2021, 7:32 PM

## 2021-04-29 NOTE — ED Provider Notes (Signed)
[facility] Emergency Department Provider Note  ____________________________________________   Event Date/Time   First MD Initiated Contact with Patient 04/29/21 1554     (approximate)  I have reviewed the triage vital signs and the nursing notes.   HISTORY  Chief Complaint No chief complaint on file.   HPI Katie Saunders is a 87 y.o. female chronic kidney disease stage IV, polycystic kidney disease, hypertension,anemia of chronic kidney disease, and secondary hyperparathyroidism and dementia also reported recent urinary tract infection diagnosis unwilling to take antibiotics over the last couple of days who presents via EMS from nursing facility with concerns that she has become more weak confused and combative over the last couple days.  Patient is nonverbal on arrival and unable provide any history.  Per staff at facility were able to verify that while patient is sometimes will say a few words she is not oriented at baseline.  Staff also reports that she had a fall yesterday but are able to clarify any additional details.          Past Medical History:  Diagnosis Date   Anxiety    Cancer [facility])    breast Left   Cataract    Dementia ([place])    Depression    Glaucoma    Gout    Hyperlipidemia    Osteoporosis    Postmastectomy lymphedema    Renal insufficiency     Patient Active Problem List   Diagnosis Date Noted   Osteoarthritis of hip 12/28/2018   Secondary hyperparathyroidism, non-renal ([place]) 06/15/2018   Breast cancer ([place]) 05/16/2017   Lymphedema 05/16/2017   Advanced care planning/counseling discussion 02/23/2017   Iron deficiency anemia 09/15/2016   B12 deficiency 09/15/2016   Postprandial diarrhea 09/15/2016   Leg edema, left 05/31/2016   Status post total left knee replacement 04/05/2016   Benign hypertensive renal disease 12/01/2015   Gout 09/22/2015   Dementia ([place]) 09/22/2015   Glaucoma 05/26/2015   Hyperlipidemia    CKD  stage 4 secondary to hypertension [facility])     Past Surgical History:  Procedure Laterality Date   ABDOMINAL HYSTERECTOMY     BREAST SURGERY     EYE SURGERY     KNEE ARTHROPLASTY Left 04/05/2016   Procedure: COMPUTER ASSISTED TOTAL KNEE ARTHROPLASTY;  Surgeon: Dereck Leep, MD;  Location: [facility] ORS;  Service: Orthopedics;  Laterality: Left;   Left knee arthroscopy  09/19/2009   Dr. Margaretmary Eddy    Prior to Admission medications   Medication Sig Start Date End Date Taking? Authorizing Provider  acetaminophen (TYLENOL) 325 MG tablet Take 650 mg by mouth every 6 (six) hours as needed.    [provider]  calcitRIOL (ROCALTROL) 0.25 MCG capsule TAKE 1 CAPSULE BY MOUTH ONCE DAILY FOR CALCIUM DEFICIENCY 09/25/19   [name], [name], DO  cyanocobalamin 1000 MCG tablet Take 1,000 mcg by mouth daily.    [provider]  donepezil (ARICEPT) 5 MG tablet TAKE 1 TABLET BY MOUTH EACH NIGHT AT BEDTIME FOR DEMENTIA W/O BEHAVIORAL DISTURBANCE 09/25/19   Huizinga, [name], DO  febuxostat (ULORIC) 40 MG tablet TAKE 1 TABLET BY MOUTH EVERY DAY FOR GOUT 02/18/20   Mazzuca, [name], DO  Guaifenesin 1200 MG TB12 Take by mouth. 11/16/18   [provider]  hydrochlorothiazide (HYDRODIURIL) 25 MG tablet TAKE 1 TABLET BY MOUTH EVERY DAY FOR BLOOD PRESSURE 09/25/19   Usery, [name], DO  losartan (COZAAR) 100 MG tablet TAKE 1 TABLET BY MOUTH ONCE  DAILY FOR BLOOD PRESSRE 09/25/19   Stavola, [name], DO  metoprolol tartrate (LOPRESSOR) 50 MG tablet TAKE 1 TABLET BY MOUTH ONCE DAILY FOR BLOOD PRESSURE 07/15/20   [name], DO  Multiple Vitamin (MULTI-VITAMINS) TABS ONCE EVERY DAY 04/19/19   [name], [name], DO  predniSONE (DELTASONE) 10 MG tablet 6 tabs day 1 and day 2, 5 tabs the next 2 days, decrease by 1 every other day until gone Patient not taking: Reported on 07/11/2020 11/15/19   [name], DO  tiotropium (SPIRIVA HANDIHALER) 18 MCG inhalation capsule Place 1 capsule (18 mcg total) into  inhaler and inhale daily. Patient not taking: Reported on 07/11/2020 09/25/19   [name], DO  traZODone (DESYREL) 50 MG tablet Take 50 mg by mouth every 8 (eight) hours as needed for sleep (as needed for agitation and insomnia). Take half tab    [provider]    Allergies Patient has no known allergies.  Family History  Problem Relation Age of Onset   Cancer Father     Social History Social History   Tobacco Use   Smoking status: Never   Smokeless tobacco: Never  Vaping Use   Vaping Use: Never used  Substance Use Topics   Alcohol use: No   Drug use: No    Review of Systems  Review of Systems  Unable to perform ROS: Dementia     ____________________________________________   PHYSICAL EXAM:  VITAL SIGNS: ED Triage Vitals [04/29/21 1600]  Enc Vitals Group     BP      Pulse      Resp      Temp      Temp src      SpO2 95 %     Weight      Height      Head Circumference      Peak Flow      Pain Score      Pain Loc      Pain Edu?      Excl. in [place]?    Vitals:   04/29/21 1600  SpO2: 95%   Physical Exam Vitals and nursing note reviewed.  Constitutional:      General: She is in acute distress.     Appearance: She is well-developed. She is ill-appearing.  HENT:     Head: Normocephalic and atraumatic.     Right Ear: External ear normal.     Left Ear: External ear normal.     Nose: Nose normal.     Mouth/Throat:     Mouth: Mucous membranes are dry.  Eyes:     Conjunctiva/sclera: Conjunctivae normal.  Cardiovascular:     Rate and Rhythm: Regular rhythm. Tachycardia present.     Heart sounds: No murmur heard. Pulmonary:     Effort: Pulmonary effort is normal. No respiratory distress.     Breath sounds: Normal breath sounds.  Abdominal:     Palpations: Abdomen is soft.     Tenderness: There is no abdominal tenderness.  Musculoskeletal:     Cervical back: Neck supple.  Skin:    General: Skin is warm and dry.     Capillary Refill:  Capillary refill takes more than 3 seconds.  Neurological:     Mental Status: She is alert.     Comments: Patient is looking around the room and moving all extremities spontaneously.  PERRLA.  EOMI.  She does not follow any commands or answer any direct questions.  There is no obvious trauma to the face scalp head or neck.  No tenderness step-offs or deformities over the C/T/L-spine.  2+ radial and DP pulses.  Some very superficial scattered bruises over the lower extremities without any other evidence of trauma i.e. deformities, effusions, lacerations or other clear evidence of tenderness.  She is tachycardic is dry mucous membranes and slightly decreased cap refill. ____________________________________________   LABS (all labs ordered are listed, but only abnormal results are displayed)  Labs Reviewed  LACTIC ACID, PLASMA - Abnormal; Notable for the following components:      Result Value   Lactic Acid, Venous 2.2 (*)    All other components within normal limits  COMPREHENSIVE METABOLIC PANEL - Abnormal; Notable for the following components:   Glucose, Bld 105 (*)    BUN 31 (*)    Creatinine, Ser 1.63 (*)    Total Bilirubin 1.3 (*)    GFR, Estimated 30 (*)    All other components within normal limits  CBC WITH DIFFERENTIAL/PLATELET - Abnormal; Notable for the following components:   Hemoglobin 11.9 (*)    All other components within normal limits  URINALYSIS, COMPLETE (UACMP) WITH MICROSCOPIC - Abnormal; Notable for the following components:   Color, Urine YELLOW (*)    APPearance CLOUDY (*)    Hgb urine dipstick SMALL (*)    Ketones, ur 5 (*)    Protein, ur 100 (*)    Leukocytes,Ua LARGE (*)    WBC, UA >50 (*)    Bacteria, UA MANY (*)    All other components within normal limits  URINE CULTURE  CULTURE, BLOOD (SINGLE) W REFLEX TO ID PANEL  RESP PANEL BY RT-PCR (FLU A&B, COVID) ARPGX2  PROTIME-INR  APTT  TSH  MAGNESIUM  PROCALCITONIN    ____________________________________________  EKG  ____________________________________________  RADIOLOGY  ED MD interpretation: Chest x-ray has no evidence of rib fracture, pneumothorax, effusion, edema, focal consolidation or other clear acute intrathoracic process.  Plain film of the hip shows no clear fracture dislocation.  Official radiology report(s): DG Pelvis Portable  Result Date: 04/29/2021 CLINICAL DATA:  Fall.  Urinary tract infection for 2 weeks. EXAM: PORTABLE PELVIS 1-2 VIEWS COMPARISON:  CT 03/01/2013 FINDINGS: Single oblique view of the pelvis. Femoral heads are located. No gross fracture. Joint spaces are maintained for age. IMPRESSION: No acute findings, given limitation of mild obliquity.  For Electronically Signed   By: Abigail Miyamoto M.D.   On: 04/29/2021 16:50   DG Chest Port 1 View  Result Date: 04/29/2021 CLINICAL DATA:  Sepsis. EXAM: PORTABLE CHEST 1 VIEW COMPARISON:  None. FINDINGS: Normal heart size. No pleural effusion or edema. No airspace opacities identified. Remote healed right posterior rib fracture deformities identified. The visualized osseous structures are unremarkable. IMPRESSION: No acute cardiopulmonary abnormalities. Electronically Signed   By: Kerby Moors M.D.   On: 04/29/2021 16:52    ____________________________________________   PROCEDURES  Procedure(s) performed (including Critical Care):  Procedures   ____________________________________________   INITIAL IMPRESSION / ASSESSMENT AND PLAN / ED COURSE     Patient presents with above-stated history and exam for assessment of some worsening combativeness confusion and weakness as well as reported fall yesterday in the setting of dementia.  She also reportedly has not been taking antibiotics for recently diagnosed UTI.  On arrival patient is tachycardic and not hypoxic although somewhat difficult to obtain accurate blood pressures given patient is repeatedly pulling cough off her arm  and attempting to push away staff who is  trying to place an IV.    Patient given some IM sedation on arrival for patient and staff safety, undergo initial evaluation.  Initial differential is quite broad and includes acute encephalopathy from sepsis, progression of dementia, CVA, visceral or intracranial injury from recent reported fall, endocrine derangement, metabolic derangements, possible arrhythmia or ACS.  Water no obvious trauma to the extremities given she is amatory history and age will obtain a CT head and C-spine as well as plain film of the chest and pelvis.  Given she was reportedly possibly febrile earlier is tachycardic on arrival with concern for possible untreated urinary tract infection concern also about sepsis and so we will obtain a CBC, CMP, lactic acid, UA, blood culture, urine culture as well as an EKG TSH and ammonia.  She is DNR and this was updated in the chart.  We will also give IV fluids and empiric dose of Rocephin pending initial work-up.  Chest and pelvic x-rays are unremarkable.  CBC without leukocytosis and hemoglobin 11.9.  CMP without significant electrode or metabolic derangements and kidney function at baseline.  UA consistent with cystitis with small hemoglobin, 100 protein and large leukocyte esterase with 1-20 RBCs and 3-50 WBCs with many bacteria.  Magnesium unremarkable.  TSH unremarkable.  Undergoing initial work-up patient's daughter did arrive at bedside who is patient's POA after lengthy discussion determined that at this point patient would prefer to be comfort care only.  I think this is very reasonable.  Further diagnostic work-up deferred.  Patient did receive a dose of Rocephin prior to this conversation.  Comfort care orders placed.  Discussed with patient's hospice nurse who recommended admission given patient does not currently have 24-hour nursing care which I think she will likely require going forward and daughter is amenable to for her to be in  the hospital until it can be arranged.  Will admit to medicine service.     ____________________________________________   FINAL CLINICAL IMPRESSION(S) / ED DIAGNOSES  Final diagnoses:  Altered mental status, unspecified altered mental status type  Goals of care, counseling/discussion  Fall, initial encounter  Acute cystitis with hematuria    Medications  lactated ringers infusion ( Intravenous Not Given 04/29/21 1803)  haloperidol (HALDOL) tablet 0.5 mg (has no administration in time range)    Or  haloperidol (HALDOL) 2 MG/ML solution 0.5 mg (has no administration in time range)    Or  haloperidol lactate (HALDOL) injection 0.5 mg (has no administration in time range)  morphine 2 MG/ML injection 1 mg (has no administration in time range)  acetaminophen (TYLENOL) tablet 650 mg (has no administration in time range)    Or  acetaminophen (TYLENOL) suppository 650 mg (has no administration in time range)  antiseptic oral rinse (BIOTENE) solution 15 mL (has no administration in time range)  haloperidol lactate (HALDOL) injection 2 mg (2 mg Intramuscular Given 04/29/21 1619)  cefTRIAXone (ROCEPHIN) 1 g in sodium chloride 0.9 % 100 mL IVPB (0 g Intravenous Stopped 04/29/21 1802)  haloperidol lactate (HALDOL) injection 2 mg (2 mg Intramuscular Given 04/29/21 1655)     ED Discharge Orders     None        Note:  This document was prepared using Dragon voice recognition software and may include unintentional dictation errors.    Lucrezia Starch, MD 04/29/21 [phone]

## 2021-04-29 NOTE — Sepsis Progress Note (Signed)
[place] tracking the code Sepsis.

## 2021-04-29 NOTE — ED Notes (Signed)
Per MD, pt comfort care only.

## 2021-04-29 NOTE — ED Notes (Signed)
Creig Hines MD notified of critical lactic acid 2.2.

## 2021-04-29 NOTE — ED Notes (Signed)
Creig Hines MD at bedside talking with daughter regarding pt being placed on comfort care. Per MD, do not start IVF or send any more labs ordered.

## 2021-04-29 NOTE — ED Triage Notes (Signed)
Pt BIB EMS from [place] C/O hypotension, tachycardia, UTI X 2 weeks. Per EMS, pt has been noncomplaint with abx at home. Unable to obtain VS during triage d/t pt combative.

## 2021-04-29 NOTE — Progress Notes (Signed)
CODE SEPSIS - PHARMACY COMMUNICATION  **Broad Spectrum Antibiotics should be administered within 1 hour of Sepsis diagnosis**  Time Code Sepsis Called/Page Received: 17:09  Antibiotics Ordered: Ceftriaxone  Time of 1st antibiotic administration: 17:02  Additional action taken by pharmacy: n/a  If necessary, Name of Provider/Nurse Contacted: n/a    Vira Blanco ,PharmD Clinical Pharmacist  04/29/2021  5:11 PM

## 2021-04-29 NOTE — ED Notes (Signed)
This RN remains unable to obtain VS and IV access d/t pt agitation. Pt attempting to kick and bite staff. Reorientation unsuccessful. MD aware.

## 2021-04-29 NOTE — ED Notes (Signed)
DO @ bedside.

## 2021-04-30 DIAGNOSIS — Z79899 Other long term (current) drug therapy: Secondary | ICD-10-CM | POA: Diagnosis not present

## 2021-04-30 DIAGNOSIS — M810 Age-related osteoporosis without current pathological fracture: Secondary | ICD-10-CM | POA: Diagnosis present

## 2021-04-30 DIAGNOSIS — F419 Anxiety disorder, unspecified: Secondary | ICD-10-CM | POA: Diagnosis present

## 2021-04-30 DIAGNOSIS — Z9114 Patient's other noncompliance with medication regimen: Secondary | ICD-10-CM | POA: Diagnosis not present

## 2021-04-30 DIAGNOSIS — D509 Iron deficiency anemia, unspecified: Secondary | ICD-10-CM | POA: Diagnosis present

## 2021-04-30 DIAGNOSIS — M109 Gout, unspecified: Secondary | ICD-10-CM | POA: Diagnosis present

## 2021-04-30 DIAGNOSIS — I890 Lymphedema, not elsewhere classified: Secondary | ICD-10-CM | POA: Diagnosis present

## 2021-04-30 DIAGNOSIS — R319 Hematuria, unspecified: Secondary | ICD-10-CM | POA: Diagnosis not present

## 2021-04-30 DIAGNOSIS — W19XXXA Unspecified fall, initial encounter: Secondary | ICD-10-CM | POA: Diagnosis present

## 2021-04-30 DIAGNOSIS — Z515 Encounter for palliative care: Secondary | ICD-10-CM | POA: Diagnosis present

## 2021-04-30 DIAGNOSIS — R627 Adult failure to thrive: Secondary | ICD-10-CM | POA: Insufficient documentation

## 2021-04-30 DIAGNOSIS — I129 Hypertensive chronic kidney disease with stage 1 through stage 4 chronic kidney disease, or unspecified chronic kidney disease: Secondary | ICD-10-CM | POA: Diagnosis present

## 2021-04-30 DIAGNOSIS — N3001 Acute cystitis with hematuria: Secondary | ICD-10-CM | POA: Diagnosis present

## 2021-04-30 DIAGNOSIS — Z853 Personal history of malignant neoplasm of breast: Secondary | ICD-10-CM | POA: Diagnosis not present

## 2021-04-30 DIAGNOSIS — E785 Hyperlipidemia, unspecified: Secondary | ICD-10-CM | POA: Diagnosis present

## 2021-04-30 DIAGNOSIS — F0390 Unspecified dementia without behavioral disturbance: Secondary | ICD-10-CM | POA: Diagnosis present

## 2021-04-30 DIAGNOSIS — N184 Chronic kidney disease, stage 4 (severe): Secondary | ICD-10-CM | POA: Diagnosis present

## 2021-04-30 DIAGNOSIS — N390 Urinary tract infection, site not specified: Secondary | ICD-10-CM | POA: Diagnosis not present

## 2021-04-30 DIAGNOSIS — G9341 Metabolic encephalopathy: Secondary | ICD-10-CM | POA: Diagnosis present

## 2021-04-30 DIAGNOSIS — Z90710 Acquired absence of both cervix and uterus: Secondary | ICD-10-CM | POA: Diagnosis not present

## 2021-04-30 DIAGNOSIS — Q613 Polycystic kidney, unspecified: Secondary | ICD-10-CM | POA: Diagnosis not present

## 2021-04-30 DIAGNOSIS — Z20822 Contact with and (suspected) exposure to covid-19: Secondary | ICD-10-CM | POA: Diagnosis present

## 2021-04-30 DIAGNOSIS — G9340 Encephalopathy, unspecified: Secondary | ICD-10-CM | POA: Diagnosis not present

## 2021-04-30 DIAGNOSIS — H409 Unspecified glaucoma: Secondary | ICD-10-CM | POA: Diagnosis present

## 2021-04-30 DIAGNOSIS — N2581 Secondary hyperparathyroidism of renal origin: Secondary | ICD-10-CM | POA: Diagnosis present

## 2021-04-30 DIAGNOSIS — Z96652 Presence of left artificial knee joint: Secondary | ICD-10-CM | POA: Diagnosis present

## 2021-04-30 DIAGNOSIS — D631 Anemia in chronic kidney disease: Secondary | ICD-10-CM | POA: Diagnosis present

## 2021-04-30 DIAGNOSIS — Z66 Do not resuscitate: Secondary | ICD-10-CM | POA: Diagnosis present

## 2021-04-30 MED ORDER — LORAZEPAM 2 MG/ML PO CONC: 1.0000 mg | ORAL | 0 refills | Status: AC | PRN

## 2021-04-30 MED ORDER — MORPHINE SULFATE (CONCENTRATE) 10 MG/0.5ML PO SOLN: 5.0000 mg | ORAL | Status: DC | PRN

## 2021-04-30 MED ORDER — MORPHINE SULFATE (CONCENTRATE) 10 MG/0.5ML PO SOLN: 5.0000 mg | ORAL | 0 refills | Status: AC | PRN

## 2021-04-30 NOTE — ED Notes (Signed)
Spoke with [name] daughter, informed her that pt is on her way back to [facility]. Daughter acknowledged.

## 2021-04-30 NOTE — Progress Notes (Signed)
[facility] ED 9  [facility]) RN Hospital Liaison Note:  Katie Saunders is a current hospice patient with a terminal diagnosis of hypertensive chronic kidney disease and dementia. Patient transported to hospital s/p fall and agitation. Patient admitted to hospital with diagnosis of UTI and sepsis, but daughter request that patient return to [facility] with hospice services and focus only on comfort care.   Transport to [employer] up by [employer].   Please let me know if you have any hospice related questions.   Thank you,   Bobbie "Loren Racer, [place], BSN [facility] Liaison [phone]

## 2021-04-30 NOTE — ED Notes (Signed)
[facility] has been called

## 2021-04-30 NOTE — ED Notes (Signed)
Unsure if most recent BP check is accurate as the patient did not like the BP cuff squeezing her arm and was thrashing around in bed and very restless and tense. Pt not following commands to hold arm still.

## 2021-04-30 NOTE — TOC Transition Note (Signed)
Transition of Care [facility]) - CM/SW Discharge Note   Patient Details  Name: Katie Saunders MRN: [id number] Date of Birth: 02/08/1931  [facility]) CM/SW Contact:  Ova Freshwater Phone Number: [phone] 04/30/2021, 8:32 AM   Clinical Narrative:     Patient presented at [facility] ED due to hypotension and tachycardia.  Patient had dx of UTI 2 weeks ago. Patient resides at [facility], and is followed by [facility] for hospice services.  Patient's daughter Enriqueta Shutter [phone] [facility]) has requested comfort care measure and for patient to return to [facility].  CSW updated Bing Neighbors at [facility] [phone] on family request. CSW updated Horris Latino at [facility] w/ family request.  Patient will return to [facility] room 204, report# [phone]. Attending, and ED staff updated.  Final next level of care: [place] (from [facility], active w/ [facility]) Barriers to Discharge: No Barriers Identified   Patient Goals and CMS Choice Patient states their goals for this hospitalization and ongoing recovery are:: to return to [facility] w/ hospice      Discharge Placement                       Discharge Plan and Services In-house Referral: Clinical Social Work   Post Acute Care Choice: NA                               Social Determinants of Health (SDOH) Interventions     Readmission Risk Interventions No flowsheet data found.

## 2021-04-30 NOTE — ED Notes (Signed)
Pt placed from ED stretcher into inpatient hospital bed.

## 2021-04-30 NOTE — Discharge Summary (Signed)
[facility] [place] [facility] [place] NAME: Katie Saunders    MR#:  [id number]  DATE OF BIRTH:  [birth year]  DATE OF ADMISSION:  04/29/2021 ADMITTING PHYSICIAN: Marcelyn Bruins, MD  DATE OF DISCHARGE: 04/30/2021  PRIMARY CARE PHYSICIAN: [place]    ADMISSION DIAGNOSIS:  UTI (urinary tract infection) [N39.0]  DISCHARGE DIAGNOSIS:   Acute metabolic encephalopathy Failure to Thrive SECONDARY DIAGNOSIS:   Past Medical History:  Diagnosis Date   Anxiety    Cancer ([place])    breast Left   Cataract    Dementia ([place])    Depression    Glaucoma    Gout    Hyperlipidemia    Osteoporosis    Postmastectomy lymphedema    Renal insufficiency     HOSPITAL COURSE:    Katie Saunders is a 87 y.o. female with medical history significant of hypertension, breast cancer, CKD 4, dementia, glaucoma, gout, hyperlipidemia, anemia, lymphedema, anxiety presenting with ongoing UTI and worsening combativeness, weakness, confusion.  Acute metabolicEncephalopathy Hospice patient/comfort care > Patient with ongoing UTI at facility with nonadherence to medications.  Had become combative weak and confused in the past couple days.  Concerns for developing sepsis in the ED initially and then on further discussion with daughter [name] and decision made to stop any life prolonging treatments and focus on comfort care. > Does follow with hospice outpatient with your care.   - cont comfort care orders initiated.   Hypertension CKD 4 Coma Gout Hyperlipidemia Anemia Lymphedema - Holding all home medications in the setting of comfort care only.  Pt appears comfortable at this time. D/w dter [name] over the phone and pt will [facility]. CONSULTS OBTAINED:    DRUG ALLERGIES:  No Known Allergies  DISCHARGE MEDICATIONS:   Allergies as of 04/30/2021   No Known Allergies      Medication List     STOP taking these medications     calcitRIOL 0.25 MCG capsule Commonly known as: ROCALTROL   calcitRIOL 1 MCG/ML solution Commonly known as: ROCALTROL   cyanocobalamin 1000 MCG tablet   donepezil 5 MG tablet Commonly known as: ARICEPT   febuxostat 40 MG tablet Commonly known as: ULORIC   Guaifenesin 1200 MG Tb12   hydrochlorothiazide 25 MG tablet Commonly known as: HYDRODIURIL   losartan 100 MG tablet Commonly known as: COZAAR   metoprolol tartrate 50 MG tablet Commonly known as: LOPRESSOR   Multi-Vitamins Tabs   predniSONE 10 MG tablet Commonly known as: DELTASONE   Spiriva HandiHaler 18 MCG inhalation capsule Generic drug: tiotropium   traZODone 50 MG tablet Commonly known as: DESYREL       TAKE these medications    acetaminophen 325 MG tablet Commonly known as: TYLENOL Take 650 mg by mouth every 6 (six) hours as needed.   LORazepam 2 MG/ML concentrated solution Commonly known as: ATIVAN Place 0.5 mLs (1 mg total) under the tongue every 4 (four) hours as needed for anxiety.   morphine CONCENTRATE 10 MG/0.5ML Soln concentrated solution Take 0.25 mLs (5 mg total) by mouth every 2 (two) hours as needed for severe pain.        If you experience worsening of your admission symptoms, develop shortness of breath, life threatening emergency, suicidal or homicidal thoughts you must seek medical attention immediately by calling 911 or calling your MD immediately  if symptoms less severe.  You Must read complete instructions/literature along with all the possible  adverse reactions/side effects for all the Medicines you take and that have been prescribed to you. Take any new Medicines after you have completely understood and accept all the possible adverse reactions/side effects.   Please note  You were cared for by a hospitalist during your hospital stay. If you have any questions about your discharge medications or the care you received while you were in the hospital after you are discharged,  you can call the unit and asked to speak with the hospitalist on call if the hospitalist that took care of you is not available. Once you are discharged, your primary care physician will handle any further medical issues. Please note that NO REFILLS for any discharge medications will be authorized once you are discharged, as it is imperative that you return to your primary care physician (or establish a relationship with a primary care physician if you do not have one) for your aftercare needs so that they can reassess your need for medications and monitor your lab values. Today   SUBJECTIVE   Lethargic due to sedating meds received in the ER No fmaily at bedside  VITAL SIGNS:  Blood pressure (S) (!) 198/119, pulse 85, resp. rate 17, SpO2 100 %.  I/O:   Intake/Output Summary (Last 24 hours) at 04/30/2021 0909 Last data filed at 04/29/2021 1802 Gross per 24 hour  Intake 100 ml  Output --  Net 100 ml    PHYSICAL EXAMINATION:  GENERAL:  87 y.o.-year-old patient lying in the bed with no acute distress.  LUNGS: Normal breath sounds bilaterally, no wheezing, rales,rhonchi or crepitation. No use of accessory muscles of respiration.  CARDIOVASCULAR: S1, S2 normal. No murmurs, rubs, or gallops.  PSYCHIATRIC: The patient is lethargic--sedated Limited exam--comfort care  DATA REVIEW:   CBC  Recent Labs  Lab 04/29/21 1629  WBC 6.9  HGB 11.9*  HCT 36.1  PLT 256    Chemistries  Recent Labs  Lab 04/29/21 1629  NA 141  K 4.4  CL 106  CO2 25  GLUCOSE 105*  BUN 31*  CREATININE 1.63*  CALCIUM 9.9  MG 2.0  AST 21  ALT 13  ALKPHOS 88  BILITOT 1.3*    Microbiology Results   Recent Results (from the past 240 hour(s))  Resp Panel by RT-PCR (Flu A&B, Covid) Nasopharyngeal Swab     Status: None   Collection Time: 04/29/21  6:17 PM   Specimen: Nasopharyngeal Swab; Nasopharyngeal(NP) swabs in vial transport medium  Result Value Ref Range Status   SARS Coronavirus 2 by RT PCR  NEGATIVE NEGATIVE Final    Comment: (NOTE) SARS-CoV-2 target nucleic acids are NOT DETECTED.  The SARS-CoV-2 RNA is generally detectable in upper respiratory specimens during the acute phase of infection. The lowest concentration of SARS-CoV-2 viral copies this assay can detect is 138 copies/mL. A negative result does not preclude SARS-Cov-2 infection and should not be used as the sole basis for treatment or other patient management decisions. A negative result may occur with  improper specimen collection/handling, submission of specimen other than nasopharyngeal swab, presence of viral mutation(s) within the areas targeted by this assay, and inadequate number of viral copies(<138 copies/mL). A negative result must be combined with clinical observations, patient history, and epidemiological information. The expected result is Negative.  Fact Sheet for Patients:  EntrepreneurPulse.com.au  Fact Sheet for Healthcare Providers:  IncredibleEmployment.be  This test is no t yet approved or cleared by the [place] FDA and  has been authorized for detection and/or  diagnosis of SARS-CoV-2 by FDA under an Emergency Use Authorization (EUA). This EUA will remain  in effect (meaning this test can be used) for the duration of the COVID-19 declaration under Section 564(b)(1) of the Act, 21 U.S.C.section 360bbb-3(b)(1), unless the authorization is terminated  or revoked sooner.       Influenza A by PCR NEGATIVE NEGATIVE Final   Influenza B by PCR NEGATIVE NEGATIVE Final    Comment: (NOTE) The Xpert Xpress SARS-CoV-2/FLU/RSV plus assay is intended as an aid in the diagnosis of influenza from Nasopharyngeal swab specimens and should not be used as a sole basis for treatment. Nasal washings and aspirates are unacceptable for Xpert Xpress SARS-CoV-2/FLU/RSV testing.  Fact Sheet for Patients: EntrepreneurPulse.com.au  Fact Sheet for  Healthcare Providers: IncredibleEmployment.be  This test is not yet approved or cleared by the [place] FDA and has been authorized for detection and/or diagnosis of SARS-CoV-2 by FDA under an Emergency Use Authorization (EUA). This EUA will remain in effect (meaning this test can be used) for the duration of the COVID-19 declaration under Section 564(b)(1) of the Act, 21 U.S.C. section 360bbb-3(b)(1), unless the authorization is terminated or revoked.  Performed at [facility], [address] [place], [place] [address]     RADIOLOGY:  [place] Pelvis Portable  Result Date: 04/29/2021 CLINICAL DATA:  Fall.  Urinary tract infection for 2 weeks. EXAM: PORTABLE PELVIS 1-2 VIEWS COMPARISON:  CT 03/01/2013 FINDINGS: Single oblique view of the pelvis. Femoral heads are located. No gross fracture. Joint spaces are maintained for age. IMPRESSION: No acute findings, given limitation of mild obliquity.  For Electronically Signed   By: Abigail Miyamoto M.D.   On: 04/29/2021 16:50   DG Chest Port 1 View  Result Date: 04/29/2021 CLINICAL DATA:  Sepsis. EXAM: PORTABLE CHEST 1 VIEW COMPARISON:  None. FINDINGS: Normal heart size. No pleural effusion or edema. No airspace opacities identified. Remote healed right posterior rib fracture deformities identified. The visualized osseous structures are unremarkable. IMPRESSION: No acute cardiopulmonary abnormalities. Electronically Signed   By: Kerby Moors M.D.   On: 04/29/2021 16:52     CODE STATUS:     Code Status Orders  (From admission, onward)           Start     Ordered   04/29/21 1838  Do not attempt resuscitation (DNR)  Continuous       Question Answer Comment  In the event of cardiac or respiratory ARREST Do not call a "code blue"   In the event of cardiac or respiratory ARREST Do not perform Intubation, CPR, defibrillation or ACLS   In the event of cardiac or respiratory ARREST Use medication by any route,  position, wound care, and other measures to relive pain and suffering. May use oxygen, suction and manual treatment of airway obstruction as needed for comfort.      04/29/21 1837           Code Status History     Date Active Date Inactive Code Status Order ID Comments User Context   04/29/2021 1834 04/29/2021 1837 DNR [id number]  Marcelyn Bruins, MD ED   04/29/2021 1827 04/29/2021 1834 DNR [id number]  Lucrezia Starch, MD ED   04/29/2021 1610 04/29/2021 1827 DNR [id number]  Lucrezia Starch, MD ED   04/05/2016 1538 04/08/2016 1554 Full Code [id number]  Hooten, Laurice Record, MD Inpatient        TOTAL TIME TAKING CARE OF THIS PATIENT: 35 minutes.    Deval Mroczka  Lekeith Wulf M.D  [facility]    CC: Primary care physician; [place], [place]

## 2021-05-02 LAB — URINE CULTURE: Culture: >=100000 — AB

## 2021-05-07 ENCOUNTER — Telehealth: Payer: Self-pay

## 2021-05-07 NOTE — Telephone Encounter (Signed)
Copied from [place] [id number]. Topic: General - Other >> May 07, 2021 11:27 AM Celene Kras wrote: Reason for CRM: Pts daughter calling in today stating that the pt passed away yesterday. Advised her that appts would be cancelled and office would be informed. Please advise.

## 2021-05-18 DEATH — deceased

## 2021-07-01 ENCOUNTER — Ambulatory Visit: Payer: Medicare PPO

## 2021-07-13 ENCOUNTER — Ambulatory Visit: Payer: Medicare PPO

## 2022-06-25 IMAGING — DX DG CHEST 1V PORT
1 series · 1 of 1 positions shown · non-contrast
Comparison: None.

CLINICAL DATA: Sepsis.

EXAM:
PORTABLE CHEST 1 VIEW

[chest ap]
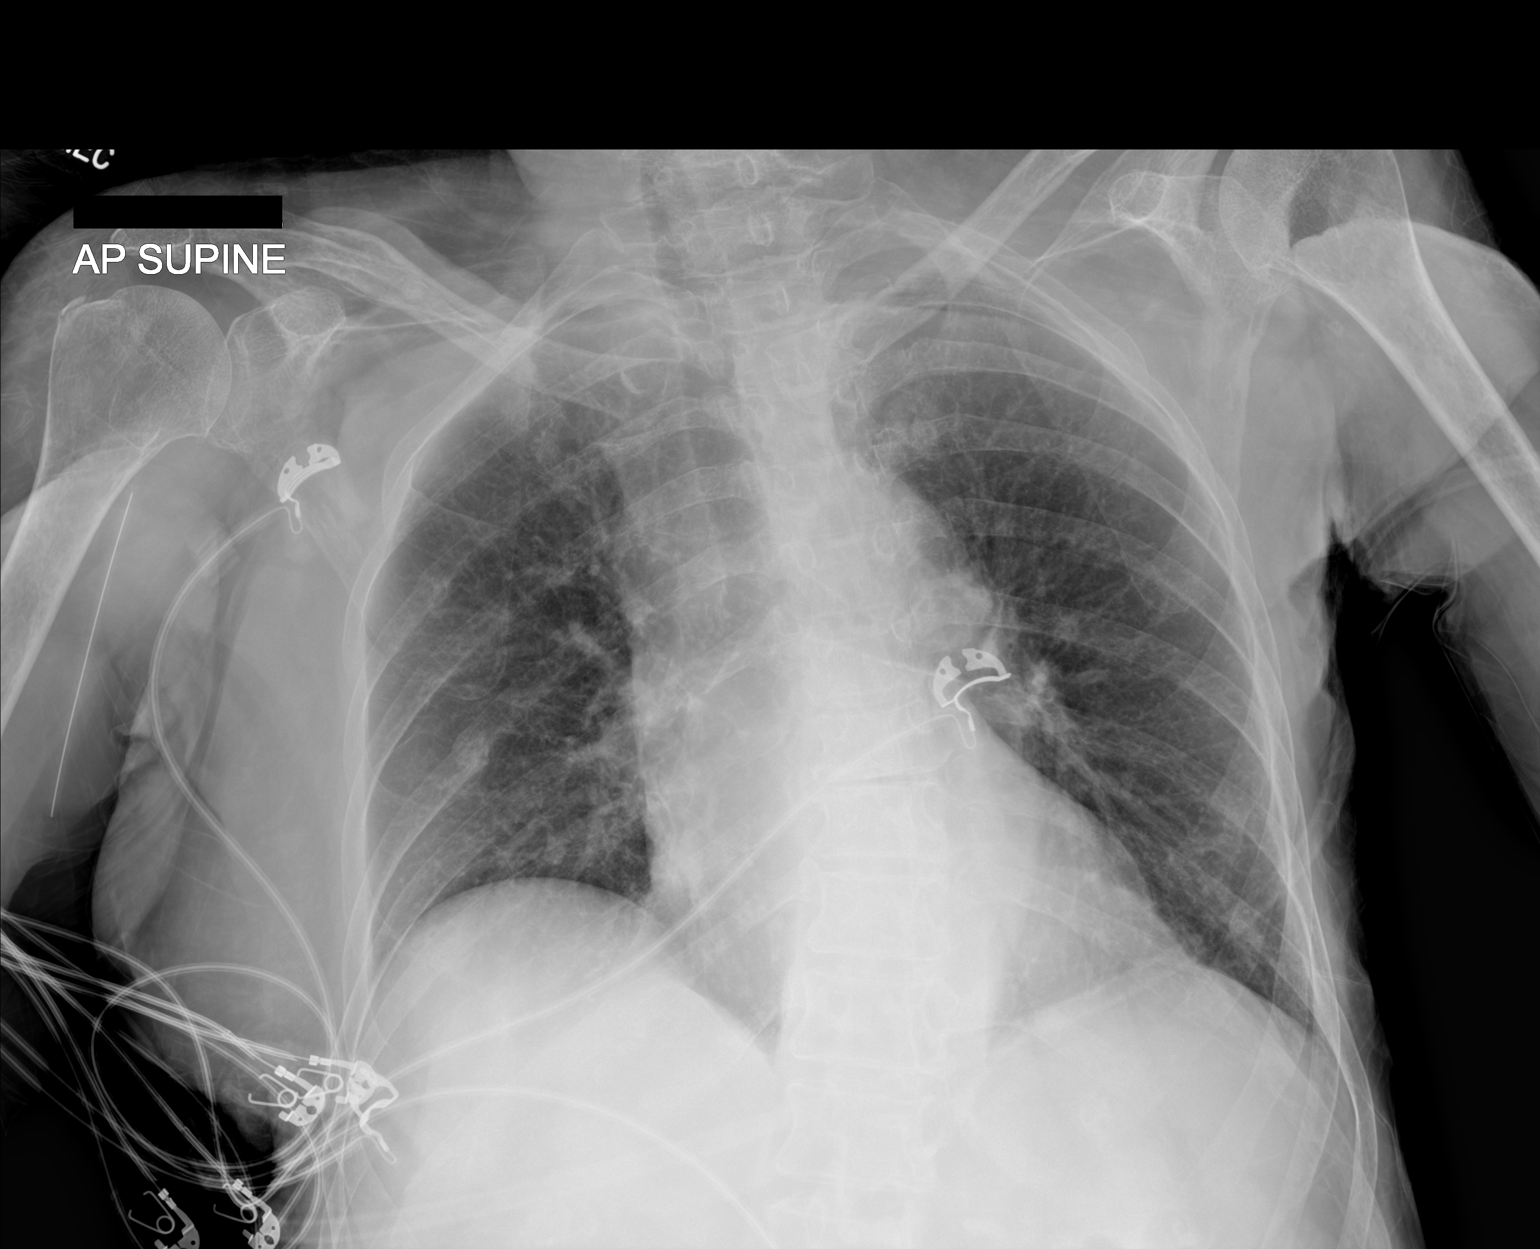

[1 of 1 positions shown; findings below may reference images not displayed]

FINDINGS: Normal heart size. No pleural effusion or edema. No airspace
opacities identified. Remote healed right posterior rib fracture
deformities identified. The visualized osseous structures are
unremarkable.
IMPRESSION: No acute cardiopulmonary abnormalities.
